# Patient Record
Sex: Female | Born: 1965 | Race: White | Hispanic: No | Smoking: Never smoker
Health system: Southern US, Community
[De-identification: ages and names within clinical notes are randomized; demographics above are authoritative.]

## PROBLEM LIST (undated history)

## (undated) DIAGNOSIS — D219 Benign neoplasm of connective and other soft tissue, unspecified: Secondary | ICD-10-CM

## (undated) DIAGNOSIS — F329 Major depressive disorder, single episode, unspecified: Secondary | ICD-10-CM

## (undated) DIAGNOSIS — F419 Anxiety disorder, unspecified: Secondary | ICD-10-CM

## (undated) DIAGNOSIS — N946 Dysmenorrhea, unspecified: Secondary | ICD-10-CM

## (undated) DIAGNOSIS — N920 Excessive and frequent menstruation with regular cycle: Secondary | ICD-10-CM

## (undated) DIAGNOSIS — F32A Depression, unspecified: Secondary | ICD-10-CM

## (undated) DIAGNOSIS — G2581 Restless legs syndrome: Secondary | ICD-10-CM

## (undated) DIAGNOSIS — E785 Hyperlipidemia, unspecified: Secondary | ICD-10-CM

## (undated) DIAGNOSIS — H9193 Unspecified hearing loss, bilateral: Secondary | ICD-10-CM

## (undated) DIAGNOSIS — R87629 Unspecified abnormal cytological findings in specimens from vagina: Secondary | ICD-10-CM

## (undated) DIAGNOSIS — I10 Essential (primary) hypertension: Secondary | ICD-10-CM

## (undated) HISTORY — DX: Unspecified abnormal cytological findings in specimens from vagina: R87.629

## (undated) HISTORY — DX: Major depressive disorder, single episode, unspecified: F32.9

## (undated) HISTORY — DX: Depression, unspecified: F32.A

## (undated) HISTORY — DX: Anxiety disorder, unspecified: F41.9

## (undated) HISTORY — PX: TONSILLECTOMY: SUR1361

## (undated) HISTORY — PX: OTHER SURGICAL HISTORY: SHX169

## (undated) HISTORY — DX: Benign neoplasm of connective and other soft tissue, unspecified: D21.9

## (undated) HISTORY — PX: INNER EAR SURGERY: SHX679

## (undated) HISTORY — DX: Excessive and frequent menstruation with regular cycle: N92.0

## (undated) HISTORY — DX: Restless legs syndrome: G25.81

## (undated) HISTORY — DX: Hyperlipidemia, unspecified: E78.5

## (undated) HISTORY — DX: Dysmenorrhea, unspecified: N94.6

## (undated) HISTORY — PX: KNEE SURGERY: SHX244

---

## 2004-09-26 ENCOUNTER — Ambulatory Visit: Payer: Self-pay

## 2004-12-18 HISTORY — PX: ABDOMINAL HYSTERECTOMY: SHX81

## 2005-09-10 ENCOUNTER — Emergency Department: Payer: Self-pay | Admitting: Emergency Medicine

## 2005-09-26 ENCOUNTER — Ambulatory Visit: Payer: Self-pay | Admitting: Obstetrics and Gynecology

## 2005-11-17 ENCOUNTER — Ambulatory Visit: Payer: Self-pay | Admitting: Obstetrics and Gynecology

## 2007-08-22 ENCOUNTER — Ambulatory Visit: Payer: Self-pay | Admitting: Obstetrics and Gynecology

## 2007-08-26 ENCOUNTER — Ambulatory Visit: Payer: Self-pay | Admitting: Obstetrics and Gynecology

## 2007-12-06 ENCOUNTER — Ambulatory Visit: Payer: Self-pay | Admitting: Obstetrics and Gynecology

## 2008-04-01 ENCOUNTER — Ambulatory Visit: Payer: Self-pay | Admitting: Obstetrics and Gynecology

## 2009-03-02 ENCOUNTER — Ambulatory Visit: Payer: Self-pay | Admitting: Obstetrics and Gynecology

## 2010-09-15 ENCOUNTER — Ambulatory Visit: Payer: Self-pay | Admitting: Obstetrics and Gynecology

## 2013-08-13 ENCOUNTER — Ambulatory Visit: Payer: Self-pay | Admitting: Obstetrics and Gynecology

## 2015-09-21 ENCOUNTER — Encounter: Payer: Self-pay | Admitting: Obstetrics and Gynecology

## 2015-09-21 ENCOUNTER — Ambulatory Visit (INDEPENDENT_AMBULATORY_CARE_PROVIDER_SITE_OTHER): Payer: BC Managed Care – PPO | Admitting: Obstetrics and Gynecology

## 2015-09-21 VITALS — BP 139/87 | HR 86 | Ht 60.0 in | Wt 135.8 lb

## 2015-09-21 DIAGNOSIS — N644 Mastodynia: Secondary | ICD-10-CM

## 2015-09-21 DIAGNOSIS — F418 Other specified anxiety disorders: Secondary | ICD-10-CM

## 2015-09-21 DIAGNOSIS — G2581 Restless legs syndrome: Secondary | ICD-10-CM | POA: Diagnosis not present

## 2015-09-21 DIAGNOSIS — N63 Unspecified lump in unspecified breast: Secondary | ICD-10-CM

## 2015-09-21 DIAGNOSIS — IMO0002 Reserved for concepts with insufficient information to code with codable children: Secondary | ICD-10-CM

## 2015-09-21 DIAGNOSIS — B977 Papillomavirus as the cause of diseases classified elsewhere: Secondary | ICD-10-CM | POA: Diagnosis not present

## 2015-09-21 DIAGNOSIS — Z1239 Encounter for other screening for malignant neoplasm of breast: Secondary | ICD-10-CM

## 2015-09-21 DIAGNOSIS — N72 Inflammatory disease of cervix uteri: Secondary | ICD-10-CM

## 2015-09-21 DIAGNOSIS — F329 Major depressive disorder, single episode, unspecified: Secondary | ICD-10-CM

## 2015-09-21 DIAGNOSIS — F419 Anxiety disorder, unspecified: Secondary | ICD-10-CM

## 2015-09-21 DIAGNOSIS — R888 Abnormal findings in other body fluids and substances: Secondary | ICD-10-CM | POA: Diagnosis not present

## 2015-09-21 NOTE — Patient Instructions (Signed)
1.  Patient is scheduled for screening mammogram 2.  Patient is given reassurance that her clinical exam today is normal. 3.  Patient is to monitor symptoms and do self breast exam monthly. 4.  Return in 2 months for Pap smear and pelvic exam.

## 2015-09-21 NOTE — Progress Notes (Signed)
Patient ID: Julia Edwards, female   DOB: 1966/06/18, 49 y.o.   MRN: [id number] Chief complaint: 1.LEFT SIDED BREAST PAIN AND PAIN UNDER ARM X WEEK- TENDER AND SWOLLEN     LAST MAMMO 2 YEARS AGO- WNL  49 year old white female, para0, status post [place]. For Uterine fibroids, Presents for evaluation of breast symptoms.  (See above). No family history of breast cancer except for a maternal aunt. Patient denies nipple discharge, trauma to breast recently.  She is not currently taking any hormone replacement therapy.  Past medical history, past surgical history, problem list, medications, and allergies are reviewed.  Review of Systems  Constitutional: Negative.   Respiratory: Negative.   Cardiovascular: Negative.   Musculoskeletal: Negative.   Skin: Negative.      OBJECTIVE:BP 139/87 mmHg  Pulse 86  Ht 5' (1.524 m)  Wt 135 lb 12.8 oz (61.598 kg)  BMI 26.52 kg/m2 Patient is a pleasant, well-appearing white female in no acute distress. Neck: Supple, without thyromegaly or adenopathy. Breasts: Bilaterally symmetric without dominant mass, adenopathy or nipple discharge.  There is no focal palpable abnormality in the area of the breast noted by the patient, which was symptomatic this past week.  ASSESSMENT: 1.  Recent breast symptoms including pain and swelling, now resolved. 2.  Normal breast exam today. 3.  In need of screening mammogram.  PLAN: 1.  Screening mammogram 2.  Continue with self breast exam monthly. 3.  Return as scheduled in 2 months for repeat Pap smear and pelvic exam.

## 2015-09-30 ENCOUNTER — Ambulatory Visit: Payer: Self-pay

## 2015-10-01 ENCOUNTER — Ambulatory Visit: Payer: Self-pay

## 2015-10-15 ENCOUNTER — Ambulatory Visit
Admission: RE | Admit: 2015-10-15 | Discharge: 2015-10-15 | Disposition: A | Discharge status: Home / Self Care | Payer: BC Managed Care – PPO | Source: Ambulatory Visit | Attending: Obstetrics and Gynecology | Admitting: Obstetrics and Gynecology

## 2015-10-15 DIAGNOSIS — Z1239 Encounter for other screening for malignant neoplasm of breast: Secondary | ICD-10-CM

## 2015-10-15 DIAGNOSIS — Z1231 Encounter for screening mammogram for malignant neoplasm of breast: Secondary | ICD-10-CM | POA: Diagnosis not present

## 2015-11-02 ENCOUNTER — Ambulatory Visit: Payer: Self-pay | Admitting: Obstetrics and Gynecology

## 2015-11-04 ENCOUNTER — Ambulatory Visit: Payer: Self-pay | Admitting: Obstetrics and Gynecology

## 2015-11-30 ENCOUNTER — Ambulatory Visit: Payer: Self-pay | Admitting: Obstetrics and Gynecology

## 2015-12-30 ENCOUNTER — Other Ambulatory Visit: Payer: Self-pay

## 2015-12-30 MED ORDER — CITALOPRAM HYDROBROMIDE 40 MG PO TABS: 40.0000 mg | ORAL_TABLET | Freq: Every day | ORAL | Status: DC

## 2016-01-20 ENCOUNTER — Ambulatory Visit: Payer: Self-pay | Admitting: Obstetrics and Gynecology

## 2016-01-27 ENCOUNTER — Ambulatory Visit (INDEPENDENT_AMBULATORY_CARE_PROVIDER_SITE_OTHER): Payer: BC Managed Care – PPO | Admitting: Obstetrics and Gynecology

## 2016-01-27 ENCOUNTER — Encounter: Payer: Self-pay | Admitting: Obstetrics and Gynecology

## 2016-01-27 VITALS — BP 127/74 | HR 81 | Ht 60.5 in | Wt 148.6 lb

## 2016-01-27 DIAGNOSIS — R896 Abnormal cytological findings in specimens from other organs, systems and tissues: Secondary | ICD-10-CM | POA: Diagnosis not present

## 2016-01-27 DIAGNOSIS — IMO0002 Reserved for concepts with insufficient information to code with codable children: Secondary | ICD-10-CM

## 2016-01-27 NOTE — Progress Notes (Signed)
Chief complaint: 1.  History of dysplasia. 2.  6 month interval Pap smear..  Patient presents for 6 month interv Pap smear.  Colposcopy was adequate in 04/29/2015.  ECC done at that time was negative.  Plan is for serial Pap smears every 6 months.  OBJECTIVE: BP 127/74 mmHg  Pulse 81  Ht 5' 0.5" (1.537 m)  Wt 148 lb 9.6 oz (67.405 kg)  BMI 28.53 kg/m2 Pleasant, well-appearing white female in no acute dis Pelvic exam: External genitalia-normal BUS-normal Vagina-normal Cervix-no lesions; no discharge. Left thigh-2 pedunculated papules 7 mm  ASSESSMENT: 1.  History of dysplasia   PLAN: 1.  Pap Smear, reflex 2.  Return in 6 months for Pap and annual examination  Brayton Mars, MD  Note: This dictation was prepared with Dragon dictation along with smaller phrase technology. Any transcriptional errors that result from this process are unintentional.

## 2016-01-27 NOTE — Patient Instructions (Signed)
1.  Pap smear is done today. 2.Return in 6 months for annual exam

## 2016-02-01 ENCOUNTER — Other Ambulatory Visit: Payer: BC Managed Care – PPO

## 2016-02-01 DIAGNOSIS — R300 Dysuria: Secondary | ICD-10-CM

## 2016-02-01 DIAGNOSIS — R350 Frequency of micturition: Secondary | ICD-10-CM

## 2016-02-01 NOTE — Progress Notes (Signed)
Pt stopped by office and left urine sample because of urinary frequency and burning. Unable to do urinalysis due to discoloration of what looked like Pyridium.

## 2016-02-02 ENCOUNTER — Telehealth: Payer: Self-pay

## 2016-02-02 LAB — URINALYSIS, ROUTINE W REFLEX MICROSCOPIC
BILIRUBIN UA: NEGATIVE
Glucose, UA: NEGATIVE
KETONES UA: NEGATIVE
Nitrite, UA: POSITIVE — AB
PROTEIN UA: NEGATIVE
SPEC GRAV UA: 1.021 (ref 1.005–1.030)
Urobilinogen, Ur: 1 mg/dL (ref 0.2–1.0)
pH, UA: 5.5 (ref 5.0–7.5)

## 2016-02-02 LAB — MICROSCOPIC EXAMINATION: Casts: NONE SEEN /lpf

## 2016-02-02 LAB — PAP IG W/ RFLX HPV ASCU: PAP Smear Comment: 0

## 2016-02-02 MED ORDER — NITROFURANTOIN MONOHYD MACRO 100 MG PO CAPS: 100.0000 mg | ORAL_CAPSULE | Freq: Two times a day (BID) | ORAL | Status: DC

## 2016-02-02 NOTE — Telephone Encounter (Signed)
Pt aware. Med erx. 

## 2016-02-02 NOTE — Telephone Encounter (Signed)
-----   Message from Brayton Mars, MD sent at 02/02/2016  3:20 PM EST ----- Please notify - Abnormal Labs Please: Macrobid twice a day for 7 days

## 2016-02-03 LAB — URINE CULTURE

## 2016-03-02 ENCOUNTER — Encounter: Payer: BC Managed Care – PPO | Admitting: Obstetrics and Gynecology

## 2016-04-25 ENCOUNTER — Encounter: Payer: Self-pay | Admitting: Obstetrics and Gynecology

## 2016-04-25 ENCOUNTER — Ambulatory Visit (INDEPENDENT_AMBULATORY_CARE_PROVIDER_SITE_OTHER): Payer: BC Managed Care – PPO | Admitting: Obstetrics and Gynecology

## 2016-04-25 VITALS — BP 126/78 | HR 102 | Ht 60.5 in | Wt 146.6 lb

## 2016-04-25 DIAGNOSIS — N882 Stricture and stenosis of cervix uteri: Secondary | ICD-10-CM

## 2016-04-25 DIAGNOSIS — R896 Abnormal cytological findings in specimens from other organs, systems and tissues: Secondary | ICD-10-CM

## 2016-04-25 DIAGNOSIS — R87622 Low grade squamous intraepithelial lesion on cytologic smear of vagina (LGSIL): Secondary | ICD-10-CM

## 2016-04-25 DIAGNOSIS — IMO0002 Reserved for concepts with insufficient information to code with codable children: Secondary | ICD-10-CM

## 2016-04-25 NOTE — Patient Instructions (Signed)
1. Colposcopy performed today. 2. Cervical dilation was performed 3. Endocervical curettage was obtained. 4. Return in 6 months for repeat colposcopy

## 2016-04-25 NOTE — Progress Notes (Signed)
Patient ID: Julia Edwards, female   DOB: 29-Dec-1965, 49 y.o.   MRN: [id number]  SUBJECTIVE: Patient here for colposcopy. LGSIL Pap on 01/27/16. Patient is nulliparous and not on any contraception, s/p partial hysterectomy.  Not a smoker. Has a hx of abnormal Paps.  OBJECTIVE: BP 126/78, pulse 102, Ht 5' 0.5" (1.537 m), Wt 146 lb 9.6 oz (66.497 kg). Pleasant, well-appearing female, NAD. Pelvic Exam  External: Normal  BUS: Normal  Vagina: Normal  Cervix: Pinpoint OS. Nabothian cyst at 1 o'clock. Stenotic.   PROCEDURE: Colposcopy upper adjacent vagina with ECC following endocervical canal dilation DESCRIPTION: Patient was placed in dorsal lithotomy position. A Graves' speculum was placed into the vagina. Vagina and cervix was swabbed with acetic acid solution. No ectocervical lesions are identified. Upper vagina is normal. The squamocolumnar junction could not be visualized due to pinpoint stenotic os. Lacrimal duct probes are performed to dilate the endocervical canal. ECC is performed with serrated curette. (- endocervical canal dilation and ECC due to inability to visualize squamo-columnar junction.)   ASSESSMENT: 1. LGSIL Pap smear 2. Status post Julia Edwards 3. No ectocervical disease on colposcopy; SCJ was not visualized due to cervical stenosis     PLAN: 1. Endocervical canal dilation 2. ECC 3. Return in 6 months for colposcopy and Pap/ECC   Claiborne Billings Rayburn PA-S Brayton Mars, MD   I have seen, interviewed, and examined the patient in conjunction with the [facility].A. student and affirm the diagnosis and management plan. Eufemia Prindle A. Ajit Errico, MD, FACOG   Note: This dictation was prepared with Dragon dictation along with smaller phrase technology. Any transcriptional errors that result from this process are unintentional.

## 2016-04-27 LAB — PATHOLOGY

## 2016-05-09 ENCOUNTER — Encounter: Payer: Self-pay | Admitting: Obstetrics and Gynecology

## 2016-05-09 ENCOUNTER — Telehealth: Payer: Self-pay

## 2016-05-09 NOTE — Telephone Encounter (Signed)
Pt aware. Appt made for 11/15/2015.

## 2016-07-19 ENCOUNTER — Encounter: Payer: BC Managed Care – PPO | Admitting: Obstetrics and Gynecology

## 2016-07-26 ENCOUNTER — Encounter: Payer: BC Managed Care – PPO | Admitting: Obstetrics and Gynecology

## 2016-08-03 ENCOUNTER — Encounter: Payer: BC Managed Care – PPO | Admitting: Obstetrics and Gynecology

## 2016-10-05 ENCOUNTER — Encounter: Payer: BC Managed Care – PPO | Admitting: Obstetrics and Gynecology

## 2016-11-10 ENCOUNTER — Other Ambulatory Visit: Payer: Self-pay | Admitting: Obstetrics and Gynecology

## 2016-11-14 ENCOUNTER — Encounter: Payer: BC Managed Care – PPO | Admitting: Obstetrics and Gynecology

## 2016-12-28 ENCOUNTER — Other Ambulatory Visit: Payer: Self-pay | Admitting: Obstetrics and Gynecology

## 2016-12-28 DIAGNOSIS — Z1231 Encounter for screening mammogram for malignant neoplasm of breast: Secondary | ICD-10-CM

## 2017-01-02 ENCOUNTER — Encounter: Payer: BC Managed Care – PPO | Admitting: Obstetrics and Gynecology

## 2017-01-19 ENCOUNTER — Ambulatory Visit
Admission: RE | Admit: 2017-01-19 | Discharge: 2017-01-19 | Disposition: A | Discharge status: Home / Self Care | Payer: BC Managed Care – PPO | Source: Ambulatory Visit | Attending: Obstetrics and Gynecology | Admitting: Obstetrics and Gynecology

## 2017-01-19 DIAGNOSIS — Z1231 Encounter for screening mammogram for malignant neoplasm of breast: Secondary | ICD-10-CM | POA: Insufficient documentation

## 2017-01-23 ENCOUNTER — Encounter: Payer: Self-pay | Admitting: Obstetrics and Gynecology

## 2017-01-23 ENCOUNTER — Ambulatory Visit (INDEPENDENT_AMBULATORY_CARE_PROVIDER_SITE_OTHER): Payer: BC Managed Care – PPO | Admitting: Obstetrics and Gynecology

## 2017-01-23 VITALS — BP 153/82 | HR 75 | Ht 60.0 in | Wt 146.9 lb

## 2017-01-23 DIAGNOSIS — R87612 Low grade squamous intraepithelial lesion on cytologic smear of cervix (LGSIL): Secondary | ICD-10-CM

## 2017-01-23 NOTE — Patient Instructions (Signed)
1. Colposcopy is completed today without evidence of abnormality 2. Pap smear is obtained 3. Return in 1 year for annual exam

## 2017-01-23 NOTE — Progress Notes (Signed)
Chief: 1. History of dysplasia 2. Colposcopy  Cervical dysplasia history: 04/29/2015 colposcopy: Adequate; ECC-negative 01/27/2016 Pap smear-LGSIL 04/25/2016 colposcopy: Inadequate; ECC-negative; endocervical canal dilation with lacrimal duct probes is performed 01/23/2017 colposcopy: Inadequate; no biopsies; Pap smear is obtained  Past medical history, past surgical history, problem list, medications, and allergies are reviewed  OBJECTIVE: BP (!) 153/82   Pulse 75   Ht 5' (1.524 m)   Wt 146 lb 14.4 oz (66.6 kg)   BMI 28.69 kg/m  Pleasant female in no acute distress. Alert and oriented. Pelvic exam: External genitalia-normal BUS-normal Vagina-normal Cervix-stenotic os; no gross the cervical lesions Uterus-surgically absent  PROCEDURE: Colposcopy of upper adjacent vagina and cervix Verbal consent is obtained. Patient was placed in the dorsal lithotomy position. A Graves' speculum is inserted to facilitate visualization of cervix. Pap smear is obtained. Acetic acid is applied to the cervix and vagina. The SCJ is not fully visualized. No ectocervical lesions are identified. No biopsies are taken. Procedure was well-tolerated.  ASSESSMENT: 1. History of LGSIL Pap smear 2. Colposcopy without evidence of disease today  PLAN: 1. Pap smear 2. Return in 1 year for annual exam, sooner if dysplasia is identified on Pap smear  Brayton Mars, MD  Note: This dictation was prepared with Dragon dictation along with smaller phrase technology. Any transcriptional errors that result from this process are unintentional.

## 2017-01-25 LAB — PAP IG AND HPV HIGH-RISK
HPV, HIGH-RISK: NEGATIVE
PAP Smear Comment: 0

## 2017-01-29 ENCOUNTER — Encounter: Payer: Self-pay | Admitting: Obstetrics and Gynecology

## 2017-03-22 ENCOUNTER — Encounter: Payer: Self-pay | Admitting: Obstetrics and Gynecology

## 2017-03-22 ENCOUNTER — Ambulatory Visit (INDEPENDENT_AMBULATORY_CARE_PROVIDER_SITE_OTHER): Payer: BC Managed Care – PPO | Admitting: Obstetrics and Gynecology

## 2017-03-22 VITALS — BP 136/96 | HR 108 | Wt 132.2 lb

## 2017-03-22 DIAGNOSIS — F329 Major depressive disorder, single episode, unspecified: Secondary | ICD-10-CM

## 2017-03-22 DIAGNOSIS — F418 Other specified anxiety disorders: Secondary | ICD-10-CM | POA: Diagnosis not present

## 2017-03-22 DIAGNOSIS — F419 Anxiety disorder, unspecified: Principal | ICD-10-CM

## 2017-03-22 MED ORDER — VENLAFAXINE HCL ER 37.5 MG PO CP24: 37.5000 mg | ORAL_CAPSULE | Freq: Every day | ORAL | 1 refills | Status: DC

## 2017-03-22 NOTE — Patient Instructions (Signed)
1. Begin Effexor XL are 37.5 mg daily 2. Follow-up with Dr. Nicolasa Ducking in psychiatry in [place]

## 2017-03-22 NOTE — Progress Notes (Signed)
Chief complaint: 1. Anxiety/depression  The patient presents today for evaluation of anxiety/depression. She is under extreme life stressors and is seeing a Social worker and the employee assistance program. Patient is in the process of possibly old through a divorce; she has a 50-year-old adopted child. Patient has been on Wellbutrin and citalopram past with reasonable success. However, she did not like the way the medications made her feel.  PH Q-9 questionnaire today is notable for severe depression. She denies suicidal ideations  OBJECTIVE: BP (!) 136/96   Pulse (!) 108   Wt 132 lb 3.2 oz (60 kg)   BMI 25.82 kg/m  Pleasant female in no acute distress. Affect is appropriate.  PH Q-9 questionnaire score: 21  ASSESSMENT: 1. Anxiety/depression, moderate to severe without suicidal ideation  PLAN: 1. Begin Effexor XL 37.5 mg daily 2. Follow-up with [facility] in psychiatry in 6 weeks. 3. If symptomatology worsens, contact us immediately.  A total of 15 minutes were spent face-to-face with the patient during this encounter and over half of that time dealt with counseling and coordination of care.  Brayton Mars, MD  Note: This dictation was prepared with Dragon dictation along with smaller phrase technology. Any transcriptional errors that result from this process are unintentional.

## 2017-03-26 ENCOUNTER — Telehealth: Payer: Self-pay | Admitting: Obstetrics and Gynecology

## 2017-03-26 MED ORDER — CITALOPRAM HYDROBROMIDE 20 MG PO TABS: 20.0000 mg | ORAL_TABLET | Freq: Every day | ORAL | 2 refills | Status: DC

## 2017-03-26 NOTE — Telephone Encounter (Signed)
Per mad ok to give celexa 20 mg. Pt aware. Med erx.

## 2017-03-26 NOTE — Telephone Encounter (Signed)
Patient left VM that the new medication is making her symptoms worse and she would like to know if the Citalopram can be called in for her but not the 40mg  she wants something less than 40 mg  She did not leave pharamacy information Please call

## 2018-01-24 ENCOUNTER — Encounter: Payer: BC Managed Care – PPO | Admitting: Obstetrics and Gynecology

## 2018-02-25 ENCOUNTER — Encounter: Payer: Self-pay | Admitting: Obstetrics and Gynecology

## 2018-02-25 ENCOUNTER — Emergency Department: Payer: BC Managed Care – PPO

## 2018-02-25 ENCOUNTER — Ambulatory Visit (INDEPENDENT_AMBULATORY_CARE_PROVIDER_SITE_OTHER): Payer: BC Managed Care – PPO | Admitting: Obstetrics and Gynecology

## 2018-02-25 ENCOUNTER — Other Ambulatory Visit: Payer: Self-pay

## 2018-02-25 VITALS — BP 131/84 | HR 76 | Ht 60.0 in | Wt 142.9 lb

## 2018-02-25 DIAGNOSIS — Z1231 Encounter for screening mammogram for malignant neoplasm of breast: Secondary | ICD-10-CM | POA: Diagnosis not present

## 2018-02-25 DIAGNOSIS — Z90711 Acquired absence of uterus with remaining cervical stump: Secondary | ICD-10-CM

## 2018-02-25 DIAGNOSIS — Z1211 Encounter for screening for malignant neoplasm of colon: Secondary | ICD-10-CM | POA: Diagnosis not present

## 2018-02-25 DIAGNOSIS — R079 Chest pain, unspecified: Secondary | ICD-10-CM | POA: Insufficient documentation

## 2018-02-25 DIAGNOSIS — Z79899 Other long term (current) drug therapy: Secondary | ICD-10-CM | POA: Diagnosis not present

## 2018-02-25 DIAGNOSIS — Z01419 Encounter for gynecological examination (general) (routine) without abnormal findings: Secondary | ICD-10-CM | POA: Diagnosis not present

## 2018-02-25 DIAGNOSIS — Z1239 Encounter for other screening for malignant neoplasm of breast: Secondary | ICD-10-CM

## 2018-02-25 LAB — CBC
HEMATOCRIT: 39.8 % (ref 35.0–47.0)
HEMOGLOBIN: 13.7 g/dL (ref 12.0–16.0)
MCH: 31.2 pg (ref 26.0–34.0)
MCHC: 34.4 g/dL (ref 32.0–36.0)
MCV: 90.5 fL (ref 80.0–100.0)
Platelets: 229 10*3/uL (ref 150–440)
RBC: 4.4 MIL/uL (ref 3.80–5.20)
RDW: 13.4 % (ref 11.5–14.5)
WBC: 6.5 10*3/uL (ref 3.6–11.0)

## 2018-02-25 NOTE — ED Triage Notes (Signed)
Pt arrives to ED via POV with c/o chest pain x2 days. Pt reports (+) nausea, but denies vomiting. Pt reports left-sided chest pain without radiation, but does state that pain increases with inspiration. Pt denies previous cardiac history. Pt reports (+) dizziness, but no reported LOC. Pt is A&O, in NAD; RR even, regular, and unlabored.

## 2018-02-25 NOTE — Progress Notes (Signed)
ANNUAL PREVENTATIVE CARE GYN  ENCOUNTER NOTE  Subjective:       Julia Edwards is a 52 y.o. G0P0000 female here for a routine annual gynecologic exam.  Current complaints: 1. No complaints  Mild vasomotor symptoms are noted; hot flashes occur primarily at night and wake her up from sleep; however, she does not desire any treatment.  She is not experiencing any vaginal dryness or dyspareunia  Bowel function is normal. Bladder function is normal. Patient has been started on sertraline by Dr. Jake Michaelis and is doing well with control of anxiety symptoms.   Gynecologic History No LMP recorded. Patient has had a hysterectomy. Contraception: status post hysterectomy status post [place]  last Pap: 02/02/2017 lgsil/negResults were: abnormal Last mammogram: 01/22/2017 birad 1. Results were: normal  Obstetric History OB History  Gravida Para Term Preterm AB Living  0 0 0 0 0 0  SAB TAB Ectopic Multiple Live Births  0 0 0 0          Past Medical History:  Diagnosis Date  . Anxiety   . Depression   . Fibroids    h/o  . Heavy periods   . Hyperlipemia   . Painful menstrual periods   . RLS (restless legs syndrome)   . 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 ABDOMINAL HYSTERECTOMY  2006   lsh  . colposcopy    . INNER EAR SURGERY  [id number]    Current Outpatient Medications on File Prior to Visit  Medication Sig Dispense Refill  . sertraline (ZOLOFT) 100 MG tablet Take 100 mg by mouth daily.     No current facility-administered medications on file prior to visit.     Allergies  Allergen Reactions  . Codeine Itching and Nausea And Vomiting  . Darvon [Propoxyphene] Itching and Nausea And Vomiting  . Sulfa Antibiotics   . Ultram [Tramadol Hcl] Itching and Nausea And Vomiting    Social History   Socioeconomic History  . Marital status: Legally Separated    Spouse name: Not on file  . Number of children: Not  on file  . Years of education: Not on file  . Highest education level: Not on file  Social Needs  . Financial resource strain: Not on file  . Food insecurity - worry: Not on file  . Food insecurity - inability: Not on file  . Transportation needs - medical: Not on file  . Transportation needs - non-medical: Not on file  Occupational History  . Not on file  Tobacco Use  . Smoking status: Never Smoker  . Smokeless tobacco: Never Used  Substance and Sexual Activity  . Alcohol use: Yes    Comment: occas  . Drug use: No  . Sexual activity: Not Currently    Birth control/protection: Surgical  Other Topics Concern  . Not on file  Social History Narrative  . Not on file    Family History  Problem Relation Age of Onset  . COPD Mother   . Lung cancer Father   . Heart disease Maternal Grandmother   . Heart disease Maternal Grandfather   . Heart disease Paternal Grandmother   . Heart disease Paternal Grandfather   . Breast cancer Maternal Aunt 70  . Cancer Neg Hx   . Diabetes Neg Hx     The following portions of the patient's history were reviewed and updated as appropriate: allergies, current medications, past family  history, past medical history, past social history, past surgical history and problem list.  Review of Systems Review of Systems  Constitutional:       Night sweats, mild, not affecting sleep  HENT: Negative.   Eyes: Negative.   Cardiovascular: Negative.   Gastrointestinal: Negative.   Genitourinary: Negative.   Musculoskeletal: Negative.   Skin:       Multiple skin tags are present; patient encouraged to see dermatology for removal  Neurological: Negative.   Endo/Heme/Allergies: Negative.   Psychiatric/Behavioral: Negative.        On sertraline with good control of symptoms      Objective:   BP 131/84   Pulse 76   Ht 5' (1.524 m)   Wt 142 lb 14.4 oz (64.8 kg)   BMI 27.91 kg/m  CONSTITUTIONAL: Well-developed, well-nourished female in no acute  distress.  PSYCHIATRIC: Normal mood and affect. Normal behavior. Normal judgment and thought content. Hunter: Alert and oriented to person, place, and time. Normal muscle tone coordination. No cranial nerve deficit noted. HENT:  Normocephalic, atraumatic, External right and left ear normal. Oropharynx is clear and moist EYES: Conjunctivae and EOM are normal. No scleral icterus.  NECK: Normal range of motion, supple, no masses.  Normal thyroid.  SKIN: Skin is warm and dry. No rash noted. Not diaphoretic. No erythema. No pallor. CARDIOVASCULAR: Normal heart rate noted, regular rhythm, no murmur. RESPIRATORY: Clear to auscultation bilaterally. Effort and breath sounds normal, no problems with respiration noted. BREASTS: Symmetric in size. No masses, skin changes, nipple drainage, or lymphadenopathy. ABDOMEN: Soft, normal bowel sounds, no distention noted.  No tenderness, rebound or guarding.  BLADDER: Normal PELVIC:  External Genitalia: Normal  BUS: Normal  Vagina: Normal estrogen effect; good vault support  Cervix: No lesions; no cervical motion tenderness; good support high in the vagina  Uterus: Surgically absent  Adnexa: Normal; nonpalpable nontender  RV: External Exam NormaI, No Rectal Masses and Normal Sphincter tone  MUSCULOSKELETAL: Normal range of motion. No tenderness.  No cyanosis, clubbing, or edema.  2+ distal pulses. LYMPHATIC: No Axillary, Supraclavicular, or Inguinal Adenopathy.    Assessment:   Annual gynecologic examination 52 y.o. Contraception: status post hysterectomy status post [facility] [id number] Vasomotor symptoms, mild, not affecting activities of daily living  Plan:  Pap: Pap Co Test Mammogram: Ordered Stool Guaiac Testing:  scheduled colonoscopy Labs: Lipid 1, FBS, TSH and Hemoglobin A1C Routine preventative health maintenance measures emphasized: Exercise/Diet/Weight control, Tobacco Warnings and Alcohol/Substance use risks Calcium and vitamin D  supplementation is recommended Return to [place], CMA  Brayton Mars, MD  Note: This dictation was prepared with Dragon dictation along with smaller phrase technology. Any transcriptional errors that result from this process are unintentional.

## 2018-02-25 NOTE — Patient Instructions (Signed)
1.  Pap smear is done 2.  Mammogram is ordered 3.  Screening colonoscopy is scheduled 4.  Screening labs are to be obtained through primary care 5.  Continue with healthy eating and exercise 6.  Calcium and vitamin D supplementation twice a day is recommended 7.  Return in 1 year for annual exam   Health Maintenance for Postmenopausal Women Menopause is a normal process in which your reproductive ability comes to an end. This process happens gradually over a span of months to years, usually between the ages of 4 and 48. Menopause is complete when you have missed 12 consecutive menstrual periods. It is important to talk with your health care provider about some of the most common conditions that affect postmenopausal women, such as heart disease, cancer, and bone loss (osteoporosis). Adopting a healthy lifestyle and getting preventive care can help to promote your health and wellness. Those actions can also lower your chances of developing some of these common conditions. What should I know about menopause? During menopause, you may experience a number of symptoms, such as:  Moderate-to-severe hot flashes.  Night sweats.  Decrease in sex drive.  Mood swings.  Headaches.  Tiredness.  Irritability.  Memory problems.  Insomnia.  Choosing to treat or not to treat menopausal changes is an individual decision that you make with your health care provider. What should I know about hormone replacement therapy and supplements? Hormone therapy products are effective for treating symptoms that are associated with menopause, such as hot flashes and night sweats. Hormone replacement carries certain risks, especially as you become older. If you are thinking about using estrogen or estrogen with progestin treatments, discuss the benefits and risks with your health care provider. What should I know about heart disease and stroke? Heart disease, heart attack, and stroke become more likely as you  age. This may be due, in part, to the hormonal changes that your body experiences during menopause. These can affect how your body processes dietary fats, triglycerides, and cholesterol. Heart attack and stroke are both medical emergencies. There are many things that you can do to help prevent heart disease and stroke:  Have your blood pressure checked at least every 1-2 years. High blood pressure causes heart disease and increases the risk of stroke.  If you are 49-48 years old, ask your health care provider if you should take aspirin to prevent a heart attack or a stroke.  Do not use any tobacco products, including cigarettes, chewing tobacco, or electronic cigarettes. If you need help quitting, ask your health care provider.  It is important to eat a healthy diet and maintain a healthy weight. ? Be sure to include plenty of vegetables, fruits, low-fat dairy products, and lean protein. ? Avoid eating foods that are high in solid fats, added sugars, or salt (sodium).  Get regular exercise. This is one of the most important things that you can do for your health. ? Try to exercise for at least 150 minutes each week. The type of exercise that you do should increase your heart rate and make you sweat. This is known as moderate-intensity exercise. ? Try to do strengthening exercises at least twice each week. Do these in addition to the moderate-intensity exercise.  Know your numbers.Ask your health care provider to check your cholesterol and your blood glucose. Continue to have your blood tested as directed by your health care provider.  What should I know about cancer screening? There are several types of cancer. Take the  following steps to reduce your risk and to catch any cancer development as early as possible. Breast Cancer  Practice breast self-awareness. ? This means understanding how your breasts normally appear and feel. ? It also means doing regular breast self-exams. Let your health  care provider know about any changes, no matter how small.  If you are 67 or older, have a clinician do a breast exam (clinical breast exam or CBE) every year. Depending on your age, family history, and medical history, it may be recommended that you also have a yearly breast X-ray (mammogram).  If you have a family history of breast cancer, talk with your health care provider about genetic screening.  If you are at high risk for breast cancer, talk with your health care provider about having an MRI and a mammogram every year.  Breast cancer (BRCA) gene test is recommended for women who have family members with BRCA-related cancers. Results of the assessment will determine the need for genetic counseling and BRCA1 and for BRCA2 testing. BRCA-related cancers include these types: ? Breast. This occurs in males or females. ? Ovarian. ? Tubal. This may also be called fallopian tube cancer. ? Cancer of the abdominal or pelvic lining (peritoneal cancer). ? Prostate. ? Pancreatic.  Cervical, Uterine, and Ovarian Cancer Your health care provider may recommend that you be screened regularly for cancer of the pelvic organs. These include your ovaries, uterus, and vagina. This screening involves a pelvic exam, which includes checking for microscopic changes to the surface of your cervix (Pap test).  For women ages 21-65, health care providers may recommend a pelvic exam and a Pap test every three years. For women ages 75-65, they may recommend the Pap test and pelvic exam, combined with testing for human papilloma virus (HPV), every five years. Some types of HPV increase your risk of cervical cancer. Testing for HPV may also be done on women of any age who have unclear Pap test results.  Other health care providers may not recommend any screening for nonpregnant women who are considered low risk for pelvic cancer and have no symptoms. Ask your health care provider if a screening pelvic exam is right for  you.  If you have had past treatment for cervical cancer or a condition that could lead to cancer, you need Pap tests and screening for cancer for at least 20 years after your treatment. If Pap tests have been discontinued for you, your risk factors (such as having a new sexual partner) need to be reassessed to determine if you should start having screenings again. Some women have medical problems that increase the chance of getting cervical cancer. In these cases, your health care provider may recommend that you have screening and Pap tests more often.  If you have a family history of uterine cancer or ovarian cancer, talk with your health care provider about genetic screening.  If you have vaginal bleeding after reaching menopause, tell your health care provider.  There are currently no reliable tests available to screen for ovarian cancer.  Lung Cancer Lung cancer screening is recommended for adults 15-64 years old who are at high risk for lung cancer because of a history of smoking. A yearly low-dose CT scan of the lungs is recommended if you:  Currently smoke.  Have a history of at least 30 pack-years of smoking and you currently smoke or have quit within the past 15 years. A pack-year is smoking an average of one pack of cigarettes per day  for one year.  Yearly screening should:  Continue until it has been 15 years since you quit.  Stop if you develop a health problem that would prevent you from having lung cancer treatment.  Colorectal Cancer  This type of cancer can be detected and can often be prevented.  Routine colorectal cancer screening usually begins at age 50 and continues through age 75.  If you have risk factors for colon cancer, your health care provider may recommend that you be screened at an earlier age.  If you have a family history of colorectal cancer, talk with your health care provider about genetic screening.  Your health care provider may also recommend  using home test kits to check for hidden blood in your stool.  A small camera at the end of a tube can be used to examine your colon directly (sigmoidoscopy or colonoscopy). This is done to check for the earliest forms of colorectal cancer.  Direct examination of the colon should be repeated every 5-10 years until age 75. However, if early forms of precancerous polyps or small growths are found or if you have a family history or genetic risk for colorectal cancer, you may need to be screened more often.  Skin Cancer  Check your skin from head to toe regularly.  Monitor any moles. Be sure to tell your health care provider: ? About any new moles or changes in moles, especially if there is a change in a mole's shape or color. ? If you have a mole that is larger than the size of a pencil eraser.  If any of your family members has a history of skin cancer, especially at a young age, talk with your health care provider about genetic screening.  Always use sunscreen. Apply sunscreen liberally and repeatedly throughout the day.  Whenever you are outside, protect yourself by wearing long sleeves, pants, a wide-brimmed hat, and sunglasses.  What should I know about osteoporosis? Osteoporosis is a condition in which bone destruction happens more quickly than new bone creation. After menopause, you may be at an increased risk for osteoporosis. To help prevent osteoporosis or the bone fractures that can happen because of osteoporosis, the following is recommended:  If you are 19-50 years old, get at least 1,000 mg of calcium and at least 600 mg of vitamin D per day.  If you are older than age 50 but younger than age 70, get at least 1,200 mg of calcium and at least 600 mg of vitamin D per day.  If you are older than age 70, get at least 1,200 mg of calcium and at least 800 mg of vitamin D per day.  Smoking and excessive alcohol intake increase the risk of osteoporosis. Eat foods that are rich in  calcium and vitamin D, and do weight-bearing exercises several times each week as directed by your health care provider. What should I know about how menopause affects my mental health? Depression may occur at any age, but it is more common as you become older. Common symptoms of depression include:  Low or sad mood.  Changes in sleep patterns.  Changes in appetite or eating patterns.  Feeling an overall lack of motivation or enjoyment of activities that you previously enjoyed.  Frequent crying spells.  Talk with your health care provider if you think that you are experiencing depression. What should I know about immunizations? It is important that you get and maintain your immunizations. These include:  Tetanus, diphtheria, and pertussis (Tdap)   booster vaccine.  Influenza every year before the flu season begins.  Pneumonia vaccine.  Shingles vaccine.  Your health care provider may also recommend other immunizations. This information is not intended to replace advice given to you by your health care provider. Make sure you discuss any questions you have with your health care provider. Document Released: 01/26/2006 Document Revised: 06/23/2016 Document Reviewed: 09/07/2015 Elsevier Interactive Patient Education  2018 [employer].

## 2018-02-26 ENCOUNTER — Emergency Department
Admission: EM | Admit: 2018-02-26 | Discharge: 2018-02-26 | Disposition: A | Discharge status: Home / Self Care | Payer: BC Managed Care – PPO | Attending: Emergency Medicine | Admitting: Emergency Medicine

## 2018-02-26 DIAGNOSIS — R079 Chest pain, unspecified: Secondary | ICD-10-CM

## 2018-02-26 LAB — FIBRIN DERIVATIVES D-DIMER (ARMC ONLY): Fibrin derivatives D-dimer (ARMC): 292.27 ng/mL (FEU) (ref 0.00–499.00)

## 2018-02-26 LAB — BASIC METABOLIC PANEL
Anion gap: 9 (ref 5–15)
BUN: 19 mg/dL (ref 6–20)
CALCIUM: 9.1 mg/dL (ref 8.9–10.3)
CO2: 26 mmol/L (ref 22–32)
Chloride: 105 mmol/L (ref 101–111)
Creatinine, Ser: 0.85 mg/dL (ref 0.44–1.00)
GFR calc Af Amer: >60 mL/min (ref >60)
GLUCOSE: 111 mg/dL — AB (ref 65–99)
Potassium: 3.6 mmol/L (ref 3.5–5.1)
Sodium: 140 mmol/L (ref 135–145)

## 2018-02-26 LAB — TROPONIN I
Troponin I: <0.03 ng/mL (ref <0.03)
Troponin I: <0.03 ng/mL (ref <0.03)

## 2018-02-26 MED ORDER — FAMOTIDINE 40 MG PO TABS: 40.0000 mg | ORAL_TABLET | Freq: Every evening | ORAL | 0 refills | Status: DC

## 2018-02-26 MED ORDER — GI COCKTAIL ~~LOC~~
30.0000 mL | Freq: Once | ORAL | Status: AC
  Administered 2018-02-26: 30 mL via ORAL
  Filled 2018-02-26: qty 30

## 2018-02-26 MED ORDER — KETOROLAC TROMETHAMINE 30 MG/ML IJ SOLN
30.0000 mg | Freq: Once | INTRAMUSCULAR | Status: AC
  Administered 2018-02-26: 30 mg via INTRAVENOUS
  Filled 2018-02-26: qty 1

## 2018-02-26 NOTE — ED Notes (Signed)
ED Provider at bedside. 

## 2018-02-26 NOTE — Discharge Instructions (Signed)
Please follow-up with your primary care physician for further evaluation of your chest pain.  Please return with any worsening symptoms any worsening shortness of breath dizziness lightheadedness or any other concerns.

## 2018-02-26 NOTE — ED Provider Notes (Signed)
[facility] Emergency Department Provider Note   ____________________________________________   First MD Initiated Contact with Patient 02/26/18 [phone]     (approximate)  I have reviewed the triage vital signs and the nursing notes.   HISTORY  Chief Complaint Chest Pain    HPI Julia Edwards is a 53 y.o. female who comes into the hospital today with some left-sided chest pain since yesterday.  The patient's pain is under her left breast.  She reports that it started hurting when she was taking a deep breath.  She reports it also hurts to breathe.  The patient denies any recent long trips in the car or plane.  She has not taken anything for pain.  The patient is never had this before.  She reports that it is right under her left breast.  The pain is currently a 2 out of 10 but if she moves she states it is worse.  The patient denies any shortness of breath but has some mild lightheadedness.  She denies any sweats nausea or vomiting.  The patient had some nausea late last week.  The patient decided to come into the hospital today for further evaluation of her symptoms.  Past Medical History:  Diagnosis Date  . Anxiety   . Depression   . Fibroids    h/o  . Heavy periods   . Hyperlipemia   . Painful menstrual periods   . RLS (restless legs syndrome)   . Vaginal Pap smear, abnormal    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     Patient Active Problem List   Diagnosis Date Noted  . Breast pain 09/21/2015  . Breast swelling 09/21/2015  . HPV test positive 09/21/2015  . Anxiety and depression 09/21/2015  . Restless leg syndrome 09/21/2015    Past Surgical History:  Procedure Laterality Date  . ABDOMINAL HYSTERECTOMY  2006   lsh  . colposcopy    . INNER EAR SURGERY  [id number]    Prior to Admission medications   Medication Sig Start Date End Date Taking? Authorizing Provider  famotidine (PEPCID) 40 MG tablet Take 1 tablet (40 mg total) by mouth every evening.  02/26/18 02/26/19  Loney Hering, MD  sertraline (ZOLOFT) 100 MG tablet Take 100 mg by mouth daily.    [provider]    Allergies Codeine; Darvon [propoxyphene]; Sulfa antibiotics; and Ultram [tramadol hcl]  Family History  Problem Relation Age of Onset  . COPD Mother   . Lung cancer Father   . Heart disease Maternal Grandmother   . Heart disease Maternal Grandfather   . Heart disease Paternal Grandmother   . Heart disease Paternal Grandfather   . Breast cancer Maternal Aunt 80  . Cancer Neg Hx   . Diabetes Neg Hx     Social History Social History   Tobacco Use  . Smoking status: Never Smoker  . Smokeless tobacco: Never Used  Substance Use Topics  . Alcohol use: Yes    Comment: occas  . Drug use: No    Review of Systems  Constitutional: No fever/chills Eyes: No visual changes. ENT: No sore throat. Cardiovascular:  chest pain. Respiratory:  shortness of breath. Gastrointestinal: No abdominal pain.  No nausea, no vomiting.  No diarrhea.  No constipation. Genitourinary: Negative for dysuria. Musculoskeletal: Negative for back pain. Skin: Negative for rash. Neurological: Negative for headaches, focal weakness or numbness.   ____________________________________________   PHYSICAL EXAM:  VITAL SIGNS: ED Triage Vitals  Enc Vitals Group  BP 02/25/18 2320 (!) 141/77     Pulse Rate 02/25/18 2320 73     Resp 02/25/18 2320 18     Temp 02/25/18 2320 98.3 F (36.8 C)     Temp Source 02/25/18 2320 Oral     SpO2 02/25/18 2320 97 %     Weight 02/25/18 2321 142 lb (64.4 kg)     Height 02/25/18 2321 5\' 6"  (1.676 m)     Head Circumference --      Peak Flow --      Pain Score 02/25/18 2321 6     Pain Loc --      Pain Edu? --      Excl. in [place]? --     Constitutional: Alert and oriented. Well appearing and in moderate distress. Eyes: Conjunctivae are normal. PERRL. EOMI. Head: Atraumatic. Nose: No congestion/rhinnorhea. Mouth/Throat: Mucous  membranes are moist.  Oropharynx non-erythematous. Cardiovascular: Normal rate, regular rhythm. Grossly normal heart sounds.  Good peripheral circulation. Respiratory: Normal respiratory effort.  No retractions. Lungs CTAB. Gastrointestinal: Soft and nontender. No distention.  Positive bowel sounds Musculoskeletal: No lower extremity tenderness nor edema.   Neurologic:  Normal speech and language.  Skin:  Skin is warm, dry and intact.  Psychiatric: Mood and affect are normal.   ____________________________________________   LABS (all labs ordered are listed, but only abnormal results are displayed)  Labs Reviewed  BASIC METABOLIC PANEL - Abnormal; Notable for the following components:      Result Value   Glucose, Bld 111 (*)    All other components within normal limits  CBC  TROPONIN I  TROPONIN I  FIBRIN DERIVATIVES D-DIMER ([facility] ONLY)   ____________________________________________  EKG  ED ECG REPORT I, Loney Hering, the attending physician, personally viewed and interpreted this ECG.   Date: 02/25/2018  EKG Time: 2320  Rate: 66  Rhythm: normal sinus rhythm  Axis: normal  Intervals:none  ST&T Change: none  ____________________________________________  RADIOLOGY  ED MD interpretation:  CXR: NAD  Official radiology report(s): Dg Chest 2 View  Result Date: 02/25/2018 CLINICAL DATA:  Chest pain x2 days EXAM: CHEST - 2 VIEW COMPARISON:  None. FINDINGS: The heart size and mediastinal contours are within normal limits. Both lungs are clear. The visualized skeletal structures are unremarkable. IMPRESSION: No active cardiopulmonary disease. Electronically Signed   By: Ashley Royalty M.D.   On: 02/25/2018 23:42    ____________________________________________   PROCEDURES  Procedure(s) performed: None  Procedures  Critical Care performed: No  ____________________________________________   INITIAL IMPRESSION / ASSESSMENT AND PLAN / ED COURSE  As part of my  medical decision making, I reviewed the following data within the electronic MEDICAL RECORD NUMBER Notes from prior ED visits and [place] Controlled Substance Database   This is a 52 year old female who comes into the hospital today with some chest pain.  The patient states it is been going on all day but it seems to get worse when she takes a deep breath.  My differential diagnosis includes gastritis, musculoskeletal pain, acute coronary syndrome, pulmonary embolus.  I did check some blood work to include a CBC BMP and troponin initially.  The patient had a repeat troponin which was negative.  When I did hear the story that it hurt to take a deep breath I did order a d-dimer.  The patient's d-dimer came back at 292.  The patient's chest x-ray was also negative.  I gave the patient a shot of Toradol and a GI cocktail.  I will have the patient follow-up with her primary care physician for further evaluation.  She still does need a stress test but I will also give her a prescription for Pepcid to help determine if the patient may be due to gastritis.  The patient will be discharged home.      ____________________________________________   FINAL CLINICAL IMPRESSION(S) / ED DIAGNOSES  Final diagnoses:  Chest pain, unspecified type     ED Discharge Orders        Ordered    famotidine (PEPCID) 40 MG tablet  Every evening     02/26/18 0636       Note:  This document was prepared using Dragon voice recognition software and may include unintentional dictation errors.    Loney Hering, MD 02/26/18 [phone]

## 2018-02-27 ENCOUNTER — Telehealth: Payer: Self-pay

## 2018-02-27 NOTE — Telephone Encounter (Signed)
Gastroenterology Pre-Procedure Review  Request Date: 05/24/18  Requesting Physician: Dr. Vicente Males  PATIENT REVIEW QUESTIONS: The patient responded to the following health history questions as indicated:    1. Are you having any GI issues? No  2. Do you have a personal history of Polyps? No  3. Do you have a family history of Colon Cancer or Polyps? Yes, Colon Ca Maternal Uncle age 51 4. Diabetes Mellitus? No  5. Joint replacements in the past 12 months? no 6. Major health problems in the past 3 months? No  7. Any artificial heart valves, MVP, or defibrillator? No     MEDICATIONS & ALLERGIES:    Patient reports the following regarding taking any anticoagulation/antiplatelet therapy:   Plavix, Coumadin, Eliquis, Xarelto, Lovenox, Pradaxa, Brilinta, or Effient? No  Aspirin? No   Patient confirms/reports the following medications:  Current Outpatient Medications  Medication Sig Dispense Refill  . famotidine (PEPCID) 40 MG tablet Take 1 tablet (40 mg total) by mouth every evening. 15 tablet 0  . sertraline (ZOLOFT) 100 MG tablet Take 100 mg by mouth daily.     No current facility-administered medications for this visit.     Patient confirms/reports the following allergies:  Allergies  Allergen Reactions  . Codeine Itching and Nausea And Vomiting  . Darvon [Propoxyphene] Itching and Nausea And Vomiting  . Sulfa Antibiotics   . Ultram [Tramadol Hcl] Itching and Nausea And Vomiting    No orders of the defined types were placed in this encounter.   AUTHORIZATION INFORMATION Primary Insurance: 1D#: Group #:  Secondary Insurance: 1D#: Group #:  SCHEDULE INFORMATION: Date: 05/24/18 Time: Location: [place]

## 2018-02-28 ENCOUNTER — Other Ambulatory Visit: Payer: BC Managed Care – PPO

## 2018-03-04 ENCOUNTER — Other Ambulatory Visit: Payer: Self-pay

## 2018-03-04 DIAGNOSIS — Z1211 Encounter for screening for malignant neoplasm of colon: Secondary | ICD-10-CM

## 2018-03-06 LAB — IGP, COBASHPV16/18
HPV 16: NEGATIVE
HPV 18: POSITIVE — AB
HPV OTHER HR TYPES: POSITIVE — AB
PAP SMEAR COMMENT: 0

## 2018-03-29 ENCOUNTER — Encounter: Payer: BC Managed Care – PPO | Admitting: Obstetrics and Gynecology

## 2018-05-23 ENCOUNTER — Telehealth: Payer: Self-pay | Admitting: Gastroenterology

## 2018-05-23 ENCOUNTER — Encounter: Payer: Self-pay | Admitting: Student

## 2018-05-23 NOTE — Telephone Encounter (Signed)
Pt is calling to cancel procedure for 05/24/18

## 2018-05-24 ENCOUNTER — Ambulatory Visit
Admission: RE | Admit: 2018-05-24 | Payer: BC Managed Care – PPO | Source: Ambulatory Visit | Admitting: Gastroenterology

## 2018-05-24 ENCOUNTER — Encounter: Admission: RE | Payer: Self-pay | Source: Ambulatory Visit

## 2018-05-24 SURGERY — COLONOSCOPY WITH PROPOFOL
Anesthesia: General

## 2018-11-07 ENCOUNTER — Encounter: Payer: BC Managed Care – PPO | Admitting: Obstetrics and Gynecology

## 2018-11-27 ENCOUNTER — Ambulatory Visit (INDEPENDENT_AMBULATORY_CARE_PROVIDER_SITE_OTHER): Payer: BC Managed Care – PPO | Admitting: Obstetrics and Gynecology

## 2018-11-27 ENCOUNTER — Encounter: Payer: Self-pay | Admitting: Obstetrics and Gynecology

## 2018-11-27 ENCOUNTER — Other Ambulatory Visit (HOSPITAL_COMMUNITY)
Admission: RE | Admit: 2018-11-27 | Discharge: 2018-11-27 | Disposition: A | Discharge status: Home / Self Care | Payer: BC Managed Care – PPO | Source: Ambulatory Visit | Attending: Obstetrics and Gynecology | Admitting: Obstetrics and Gynecology

## 2018-11-27 VITALS — BP 129/83 | HR 72 | Ht 60.0 in | Wt 140.9 lb

## 2018-11-27 DIAGNOSIS — N72 Inflammatory disease of cervix uteri: Secondary | ICD-10-CM

## 2018-11-27 DIAGNOSIS — R87612 Low grade squamous intraepithelial lesion on cytologic smear of cervix (LGSIL): Secondary | ICD-10-CM | POA: Insufficient documentation

## 2018-11-27 DIAGNOSIS — B977 Papillomavirus as the cause of diseases classified elsewhere: Secondary | ICD-10-CM

## 2018-11-27 NOTE — Progress Notes (Signed)
Chief complaint: 1.  Colposcopy 2.  ASCUS/positive Pap smear  52 year old female para 0, status post LSH, history of mild cervical dysplasia.  Patient reports no vaginal bleeding, vaginal discharge, or vaginal odor.  Cervical dysplasia history: Non-smoker 04/29/2015 colposcopy: Adequate; ECC-negative 01/27/2016 Pap smear-LGSIL 04/25/2016 colposcopy: Inadequate; ECC-negative; endocervical canal dilation with lacrimal duct probes is performed 01/23/2017 colposcopy: Inadequate; no biopsies; Pap smear LGSIL/negative 02/25/2018 Pap/HPV ASCUS/positive HPV 18/negative HPV 16/positive HPV other types 11/27/2018 colposcopy, inadequate; Pap/HPV obtained; no biopsies  OBJECTIVE: BP 129/83   Pulse 72   Ht 5' (1.524 m)   Wt 140 lb 14.4 oz (63.9 kg)   BMI 27.52 kg/m  Pleasant well-appearing female no acute distress.  Alert and oriented. 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 Findings: Inadequate colposcopy; no ectocervical abnormalities, no vaginal abnormalities Biopsies: None Pap smear: Obtained Description: Verbal consent is obtained.  Patient is placed in the dorsolithotomy position.  Graves speculum is used to facilitate visualization of the cervix and upper adjacent vagina.  The cervix and vagina is painted with Fox swabs soaked in acetic acid solution.  Colposcopy revealed no ectocervical abnormalities or vaginal abnormalities.  The SCJ is not visualized.  Due to the stenotic loss, and previous ECCs which have been negative, ECC is deferred.  ASSESSMENT: 1.  History of LGSIL Pap smears 2.  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  Inadequate colposcopy today  PLAN: 1.  Colposcopy as noted, without biopsies 2.  Pap/HPV 3.  Return in March 2019 for annual exam with Melody Theodoro Kalata, MD  Note: This dictation was prepared with Dragon dictation along with smaller phrase technology. Any transcriptional errors that result from this process are unintentional.

## 2018-11-27 NOTE — Patient Instructions (Signed)
1.  Pap smear is done today. 2.  Colposcopy is normal today with no evidence of dysplasia.  No biopsies were taken. 3.  Return in March 2020 for annual exam with [facility].

## 2018-12-05 LAB — CYTOLOGY - PAP
HPV 16/18/45 genotyping: NEGATIVE
HPV: DETECTED — AB

## 2019-02-27 ENCOUNTER — Encounter: Payer: BC Managed Care – PPO | Admitting: Obstetrics and Gynecology

## 2019-02-28 ENCOUNTER — Encounter: Payer: BC Managed Care – PPO | Admitting: Obstetrics and Gynecology

## 2019-03-07 ENCOUNTER — Encounter: Payer: Self-pay | Admitting: Obstetrics and Gynecology

## 2019-03-07 ENCOUNTER — Ambulatory Visit (INDEPENDENT_AMBULATORY_CARE_PROVIDER_SITE_OTHER): Payer: BC Managed Care – PPO | Admitting: Obstetrics and Gynecology

## 2019-03-07 ENCOUNTER — Other Ambulatory Visit (HOSPITAL_COMMUNITY)
Admission: RE | Admit: 2019-03-07 | Discharge: 2019-03-07 | Disposition: A | Discharge status: Home / Self Care | Payer: BC Managed Care – PPO | Source: Ambulatory Visit | Attending: Obstetrics and Gynecology | Admitting: Obstetrics and Gynecology

## 2019-03-07 ENCOUNTER — Other Ambulatory Visit: Payer: Self-pay

## 2019-03-07 VITALS — BP 127/82 | HR 76 | Ht 60.0 in | Wt 146.2 lb

## 2019-03-07 DIAGNOSIS — Z1239 Encounter for other screening for malignant neoplasm of breast: Secondary | ICD-10-CM

## 2019-03-07 DIAGNOSIS — Z1211 Encounter for screening for malignant neoplasm of colon: Secondary | ICD-10-CM | POA: Diagnosis not present

## 2019-03-07 DIAGNOSIS — Z01419 Encounter for gynecological examination (general) (routine) without abnormal findings: Secondary | ICD-10-CM | POA: Diagnosis not present

## 2019-03-07 DIAGNOSIS — R87612 Low grade squamous intraepithelial lesion on cytologic smear of cervix (LGSIL): Secondary | ICD-10-CM | POA: Diagnosis not present

## 2019-03-07 DIAGNOSIS — Z90711 Acquired absence of uterus with remaining cervical stump: Secondary | ICD-10-CM

## 2019-03-07 NOTE — Progress Notes (Signed)
Subjective:   LYNNEL ZANETTI is a 53 y.o. G0P0000 Caucasian female here for a routine well-woman exam.  No LMP recorded. Patient has had a hysterectomy.    Current complaints: frequent BMs and inability to hold it.  Has average of 2 BMs a day. No blood in stools  Worse when stress. Still feels like SSRI & SNRI are still working fine. States a lot of stress for the last few years.  PCP: Baldemar Lenis       Does need labs  Social History: Sexual: heterosexual Marital Status: married Living situation: with spouse Occupation: Education officer, community at [employer] Tobacco/alcohol: no tobacco use Illicit drugs: no history of illicit drug use  The following portions of the patient's history were reviewed and updated as appropriate: allergies, current medications, past family history, past medical history, past social history, past surgical history and problem list.  Past Medical History Past Medical History:  Diagnosis Date  . Anxiety   . Depression   . Fibroids    h/o  . Heavy periods   . Hyperlipemia   . Painful menstrual periods   . RLS (restless legs syndrome)   . 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 ABDOMINAL HYSTERECTOMY  2006   lsh  . colposcopy    . INNER EAR SURGERY  [id number]    Gynecologic History G0P0000  No LMP recorded. Patient has had a hysterectomy. Contraception: status post hysterectomy Last Pap: 11/2018. Results were: abnormal Last mammogram: 01/2017. Results were: normal   Obstetric History OB History  Gravida Para Term Preterm AB Living  0 0 0 0 0 0  SAB TAB Ectopic Multiple Live Births  0 0 0 0      Current Medications Current Outpatient Medications on File Prior to Visit  Medication Sig Dispense Refill  . buPROPion (WELLBUTRIN) 100 MG tablet TAKE 1 TABLET BY MOUTH TWICE A DAY AT 8AM AND 3PM    . CVS VITAMIN B12 1000 MCG tablet Take 1,000 mcg by mouth daily.    Marland Kitchen  lisinopril-hydrochlorothiazide (PRINZIDE,ZESTORETIC) 10-12.5 MG tablet Take by mouth.    . sertraline (ZOLOFT) 100 MG tablet Take 100 mg by mouth daily.     No current facility-administered medications on file prior to visit.     Review of Systems Patient denies any headaches, blurred vision, shortness of breath, chest pain, abdominal pain, problems with bowel movements, urination, or intercourse.  Objective:  BP 127/82   Pulse 76   Ht 5' (1.524 m)   Wt 146 lb 3.2 oz (66.3 kg)   BMI 28.55 kg/m  Physical Exam  General:  Well developed, well nourished, no acute distress. She is alert and oriented x3. Skin:  Warm and dry Neck:  Midline trachea, no thyromegaly or nodules Cardiovascular: Regular rate and rhythm, no murmur heard Lungs:  Effort normal, all lung fields clear to auscultation bilaterally Breasts:  No dominant palpable mass, retraction, or nipple discharge Abdomen:  Soft, non tender, no hepatosplenomegaly or masses Pelvic:  External genitalia is normal in appearance.  The vagina is normal in appearance. The cervix is bulbous, no CMT.  Thin prep pap is done with HR HPV cotesting. Uterus is surgically absent No adnexal masses or tenderness noted. Extremities:  No swelling or varicosities noted Psych:  She has a normal mood and affect  Assessment:   Healthy well-woman exam Abnormal pap BMI 28.55 Anxiety on SSRI HTN  Plan:  Labs obtained- will follow up accordingly F/U 6 months for repeat pap, or sooner if needed Mammogram ordered or sooner if problems Colonoscopy referred to GI   Rockney Ghee, CNM

## 2019-03-08 LAB — COMPREHENSIVE METABOLIC PANEL
A/G RATIO: 2.1 (ref 1.2–2.2)
ALK PHOS: 98 IU/L (ref 39–117)
ALT: 35 IU/L — ABNORMAL HIGH (ref 0–32)
AST: 24 IU/L (ref 0–40)
Albumin: 4.6 g/dL (ref 3.8–4.9)
BILIRUBIN TOTAL: 0.7 mg/dL (ref 0.0–1.2)
BUN/Creatinine Ratio: 19 (ref 9–23)
BUN: 14 mg/dL (ref 6–24)
CHLORIDE: 100 mmol/L (ref 96–106)
CO2: 23 mmol/L (ref 20–29)
Calcium: 9.7 mg/dL (ref 8.7–10.2)
Creatinine, Ser: 0.72 mg/dL (ref 0.57–1.00)
GFR calc Af Amer: 111 mL/min/{1.73_m2} (ref >59)
GFR, EST NON AFRICAN AMERICAN: 97 mL/min/{1.73_m2} (ref >59)
GLOBULIN, TOTAL: 2.2 g/dL (ref 1.5–4.5)
Glucose: 107 mg/dL — ABNORMAL HIGH (ref 65–99)
POTASSIUM: 4.3 mmol/L (ref 3.5–5.2)
SODIUM: 138 mmol/L (ref 134–144)
Total Protein: 6.8 g/dL (ref 6.0–8.5)

## 2019-03-08 LAB — CBC
HEMATOCRIT: 41 % (ref 34.0–46.6)
Hemoglobin: 14.6 g/dL (ref 11.1–15.9)
MCH: 32 pg (ref 26.6–33.0)
MCHC: 35.6 g/dL (ref 31.5–35.7)
MCV: 90 fL (ref 79–97)
Platelets: 204 10*3/uL (ref 150–450)
RBC: 4.56 x10E6/uL (ref 3.77–5.28)
RDW: 12.7 % (ref 11.7–15.4)
WBC: 4.4 10*3/uL (ref 3.4–10.8)

## 2019-03-08 LAB — LIPID PANEL
Chol/HDL Ratio: 7.5 ratio — ABNORMAL HIGH (ref 0.0–4.4)
Cholesterol, Total: 332 mg/dL — ABNORMAL HIGH (ref 100–199)
HDL: 44 mg/dL (ref >39)
LDL CALC: 253 mg/dL — AB (ref 0–99)
TRIGLYCERIDES: 177 mg/dL — AB (ref 0–149)
VLDL CHOLESTEROL CAL: 35 mg/dL (ref 5–40)

## 2019-03-08 LAB — HEMOGLOBIN A1C
Est. average glucose Bld gHb Est-mCnc: 108 mg/dL
HEMOGLOBIN A1C: 5.4 % (ref 4.8–5.6)

## 2019-03-08 LAB — THYROID PANEL WITH TSH
Free Thyroxine Index: 1.5 (ref 1.2–4.9)
T3 Uptake Ratio: 19 % — ABNORMAL LOW (ref 24–39)
T4, Total: 7.9 ug/dL (ref 4.5–12.0)
TSH: 2.46 u[IU]/mL (ref 0.450–4.500)

## 2019-03-08 LAB — VITAMIN D 25 HYDROXY (VIT D DEFICIENCY, FRACTURES): Vit D, 25-Hydroxy: 8.6 ng/mL — ABNORMAL LOW (ref 30.0–100.0)

## 2019-03-12 ENCOUNTER — Telehealth: Payer: Self-pay

## 2019-03-12 ENCOUNTER — Other Ambulatory Visit: Payer: Self-pay | Admitting: Obstetrics and Gynecology

## 2019-03-12 DIAGNOSIS — E559 Vitamin D deficiency, unspecified: Secondary | ICD-10-CM | POA: Insufficient documentation

## 2019-03-12 DIAGNOSIS — E7800 Pure hypercholesterolemia, unspecified: Secondary | ICD-10-CM | POA: Insufficient documentation

## 2019-03-12 MED ORDER — VITAMIN D (ERGOCALCIFEROL) 1.25 MG (50000 UNIT) PO CAPS: 50000.0000 [IU] | ORAL_CAPSULE | ORAL | 2 refills | Status: AC

## 2019-03-12 NOTE — Telephone Encounter (Signed)
LVM to inform patient her referral has been received for screening colonoscopy and once restrictions have been lifted we will call her back to schedule.  Thanks [employer]

## 2019-03-14 LAB — CYTOLOGY - PAP
DIAGNOSIS: UNDETERMINED — AB
HPV (WINDOPATH): DETECTED — AB
HPV 16/18/45 GENOTYPING: NEGATIVE

## 2019-04-15 ENCOUNTER — Other Ambulatory Visit: Payer: Self-pay

## 2019-04-15 ENCOUNTER — Ambulatory Visit (INDEPENDENT_AMBULATORY_CARE_PROVIDER_SITE_OTHER): Payer: BC Managed Care – PPO | Admitting: Gastroenterology

## 2019-04-15 DIAGNOSIS — R140 Abdominal distension (gaseous): Secondary | ICD-10-CM

## 2019-04-15 DIAGNOSIS — R197 Diarrhea, unspecified: Secondary | ICD-10-CM | POA: Diagnosis not present

## 2019-04-15 NOTE — Progress Notes (Signed)
Julia Edwards [address]  [place], [place] [address]  Main: [phone]  Fax: [phone]   Gastroenterology Consultation  Referring Provider:     Joylene Igo, CNM Primary Care Physician:  Derinda Late, MD Reason for Consultation:    Altered bowel habits, colon cancer screening        HPI:   Virtual Visit via Video Note  I connected with patient on 04/15/19 at 10:30 AM EDT by video ([email or web address]) and verified that I am speaking with the correct person using two identifiers.   I discussed the limitations, risks, security and privacy concerns of performing an evaluation and management service by video and the availability of in person appointments. I also discussed with the patient that there may be a patient responsible charge related to this service. The patient expressed understanding and agreed to proceed.  Location of the patient: Home Location of provider: Home Participating persons: Patient and provider only (Nursing staff checked in patient via phone but were not physically involved in the video interaction - see their notes)   History of Present Illness: Chief Complaint  Patient presents with   New Patient (Initial Visit)    DIARRHEA    Julia Edwards is a 53 y.o. y/o female referred for consultation & management  by Dr. Derinda Late, MD.  Patient reports 1 year history of intermittent fecal incontinence and loose stools.  Reports 2-3 loose stools a day, and fecal incontinence if she does not make it to the bathroom right away.  States this is new for her for the past year.  No blood in stool.  No nausea vomiting or abdominal pain.  No weight loss.  No medication or bowel changes.  States first she thought it was related to stress, but is occurring even when she is not stressed.  Does not wake up at night to have a bowel movement.  Has an uncle with colon cancer but no immediate family members with colon cancer.  No prior  colonoscopies.  No family members with IBD.  Also reports intermittent abdominal bloating.  Does state the loose bowel movements occur while or right after eating.  No significant diet triggers.  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 Authorizing Provider  buPROPion (WELLBUTRIN) 100 MG tablet TAKE 1 TABLET BY MOUTH TWICE A DAY AT 8AM AND 3PM 05/24/18   [provider]  CVS VITAMIN B12 1000 MCG tablet Take 1,000 mcg by mouth daily. 01/16/19   [provider]  lisinopril-hydrochlorothiazide (PRINZIDE,ZESTORETIC) 10-12.5 MG tablet Take by mouth. 03/29/18 03/29/19  [provider]  sertraline (ZOLOFT) 100 MG tablet Take 100 mg by mouth daily.    [provider]  Vitamin D, Ergocalciferol, (DRISDOL) 1.25 MG (50000 UT) CAPS capsule Take 1 capsule (50,000 Units total) by mouth 2 (two) times a week. 03/13/19   Joylene Igo, CNM    Family History  Problem Relation  Age of Onset   COPD Mother    Lung cancer Father    Heart disease Maternal Grandmother    Heart disease Maternal Grandfather    Heart disease Paternal Grandmother    Heart disease Paternal Grandfather    Breast cancer Maternal Aunt 59   Colon cancer Maternal Uncle 31   Breast cancer Other        cousin   Cancer Neg Hx    Diabetes Neg Hx      Social History   Tobacco Use   Smoking status: Never Smoker   Smokeless tobacco: Never Used  Substance Use Topics   Alcohol use: Yes    Comment: occas   Drug use: No    Allergies as of 04/15/2019 - Review  Complete 03/07/2019  Allergen Reaction Noted   Codeine Itching and Nausea And Vomiting 09/20/2015   Darvon [propoxyphene] Itching and Nausea And Vomiting 09/20/2015   Sulfa antibiotics  09/21/2015   Ultram [tramadol hcl] Itching and Nausea And Vomiting 09/20/2015    Review of Systems:    All systems reviewed and negative except where noted in HPI.   Observations/Objective:  Labs: CBC    Component Value Date/Time   WBC 4.4 03/07/2019 1002   WBC 6.5 02/25/2018 2324   RBC 4.56 03/07/2019 1002   RBC 4.40 02/25/2018 2324   HGB 14.6 03/07/2019 1002   HCT 41.0 03/07/2019 1002   PLT 204 03/07/2019 1002   MCV 90 03/07/2019 1002   MCH 32.0 03/07/2019 1002   MCH 31.2 02/25/2018 2324   MCHC 35.6 03/07/2019 1002   MCHC 34.4 02/25/2018 2324   RDW 12.7 03/07/2019 1002   CMP     Component Value Date/Time   NA 138 03/07/2019 1002   K 4.3 03/07/2019 1002   CL 100 03/07/2019 1002   CO2 23 03/07/2019 1002   GLUCOSE 107 (H) 03/07/2019 1002   GLUCOSE 111 (H) 02/25/2018 2324   BUN 14 03/07/2019 1002   CREATININE 0.72 03/07/2019 1002   CALCIUM 9.7 03/07/2019 1002   PROT 6.8 03/07/2019 1002   ALBUMIN 4.6 03/07/2019 1002   AST 24 03/07/2019 1002   ALT 35 (H) 03/07/2019 1002   ALKPHOS 98 03/07/2019 1002   BILITOT 0.7 03/07/2019 1002   GFRNONAA 97 03/07/2019 1002   GFRAA 111 03/07/2019 1002    Imaging Studies: No results found.  Assessment and Plan:   [name] is a 53 y.o. y/o female has been referred for altered bowel habits  Assessment and Plan: At this time differential is wide IBS, versus fecal incontinence due to pelvic floor dysfunction, versus IBD, versus celiac disease  No weight loss or blood in stool is reassuring.  No alarm features present. However, patient has never had a colonoscopy and is due for screening colonoscopy and given her altered bowel habits, colonoscopy for further evaluation is indicated.  Due to the current COVID-19 situation,  elective procedures are currently being scheduled for a later time as per nationwide recommendations. Therefore, the patient was informed of the need to schedule the procedure in the upcoming months, instead of sooner, since this is an elective procedure. Patient will need to be contacted at a later time to place him/her on the schedule. However, alarm symptoms were discussed in detail, and if these occur pt was advised to call us to discuss change in symptoms and evaluation for a more urgent procedure if appropriate. No indication for urgent procedure exist at this time. Patient was given the contact  information to reach [place] with any questions, concerns, or change in symptoms.   Will start with stool studies and blood work to rule out anemia, iron deficiency, evaluate inflammatory markers, fecal calprotectin, H. pylori testing.  if the above blood work is abnormal, patient may need endoscopic evaluation sooner than later  Use Metamucil to help bulk stool and help with fecal incontinence  Follow Up Instructions: Clinic follow-up in 4 to 6 weeks to reassess symptoms and above work-up  I discussed the assessment and treatment plan with the patient. The patient was provided an opportunity to ask questions and all were answered. The patient agreed with the plan and demonstrated an understanding of the instructions.   The patient was advised to call back or seek an in-person evaluation if the symptoms worsen or if the condition fails to improve as anticipated.  I provided 30 minutes of face-to-face time via video software during this encounter.   Virgel Manifold, MD  Speech recognition software was used to dictate the above note.

## 2019-04-15 NOTE — Progress Notes (Signed)
Patient ID: Julia Edwards, female   DOB: 02-23-1966, 53 y.o.   MRN: [id number] Left detailed message on voice mail where to go to get labs done with [facility] ([address] and instructions for h. Pylori breath test, no gum, no liquids, etc. 1 hr. Prior to test, and will need to obtain containers for stool samples also. To contact office if needed.

## 2019-04-19 LAB — CELIAC PANEL 10
Deamidated Gliadin Abs, IgA: 12 U (ref 0–19)
Deamidated Gliadin Abs, IgG: 2 U (ref 0–19)
Endomysial IgA: NEGATIVE
Immunoglobulin A, (IgA) QN, Serum: 247 mg/dL (ref 87–352)
t-Transglutaminase (tTG) IgA: <2 U/mL (ref 0–3)
t-Transglutaminase (tTG) IgG: 3 U/mL (ref 0–5)

## 2019-04-19 LAB — SEDIMENTATION RATE: Sed Rate: 30 mm/h (ref 0–40)

## 2019-04-19 LAB — CBC
Hematocrit: 39.4 % (ref 34.0–46.6)
Hemoglobin: 14.1 g/dL (ref 11.1–15.9)
MCH: 31.5 pg (ref 26.6–33.0)
MCHC: 35.8 g/dL — ABNORMAL HIGH (ref 31.5–35.7)
MCV: 88 fL (ref 79–97)
Platelets: 224 x10E3/uL (ref 150–450)
RBC: 4.48 x10E6/uL (ref 3.77–5.28)
RDW: 12.8 % (ref 11.7–15.4)
WBC: 4.8 x10E3/uL (ref 3.4–10.8)

## 2019-04-19 LAB — C-REACTIVE PROTEIN: CRP: 4 mg/L (ref 0–10)

## 2019-04-19 LAB — H. PYLORI BREATH TEST: H pylori Breath Test: NEGATIVE

## 2019-04-19 LAB — FERRITIN: Ferritin: 108 ng/mL (ref 15–150)

## 2019-05-15 ENCOUNTER — Ambulatory Visit: Payer: BC Managed Care – PPO | Admitting: Gastroenterology

## 2019-05-26 ENCOUNTER — Telehealth: Payer: Self-pay

## 2019-05-26 ENCOUNTER — Other Ambulatory Visit: Payer: Self-pay

## 2019-05-26 ENCOUNTER — Ambulatory Visit (INDEPENDENT_AMBULATORY_CARE_PROVIDER_SITE_OTHER): Payer: BC Managed Care – PPO | Admitting: Gastroenterology

## 2019-05-26 DIAGNOSIS — Z1211 Encounter for screening for malignant neoplasm of colon: Secondary | ICD-10-CM

## 2019-05-26 DIAGNOSIS — R195 Other fecal abnormalities: Secondary | ICD-10-CM | POA: Diagnosis not present

## 2019-05-26 NOTE — Telephone Encounter (Signed)
Left message for pt to contact office to schedule procedure (colonoscopy) for screening.

## 2019-05-26 NOTE — Progress Notes (Signed)
Julia Antigua, MD [address]  [place]  [place], [place] [address]  Main: [phone]  Fax: [phone]   Primary Care Physician: Julia Late, MD  Virtual Visit via Video Note  I connected with patient on 05/26/19 at  9:00 AM EDT by video (using [email or web address]) and verified that I am speaking with the correct person using two identifiers.   I discussed the limitations, risks, security and privacy concerns of performing an evaluation and management service by video and the availability of in person appointments. I also discussed with the patient that there may be a patient responsible charge related to this service. The patient expressed understanding and agreed to proceed.  Location of Patient: Home Location of Provider: Home Persons involved: Patient and provider only (Nursing staff checked in patient via phone but were not physically involved in the video interaction - see their notes)   History of Present Illness: Chief Complaint  Patient presents with  . Follow-up    diarrhea    HPI: Julia Edwards is a 53 y.o. female here for follow-up of loose stools.  Patient states compared to her last visit her symptoms are much better.  She states she has been eating a healthier diet and avoiding processed foods.  This has helped both with her bloating and loose stools.  She still feels urgency at times when she is out of the house but overall her symptoms have improved.  No abdominal pain.  No dysphagia, no nausea or vomiting, no heartburn.  Current Outpatient Medications  Medication Sig Dispense Refill  . buPROPion (WELLBUTRIN) 100 MG tablet TAKE 1 TABLET BY MOUTH TWICE A DAY AT 8AM AND 3PM    . CVS VITAMIN B12 1000 MCG tablet Take 1,000 mcg by mouth daily.    Marland Kitchen lisinopril-hydrochlorothiazide (PRINZIDE,ZESTORETIC) 10-12.5 MG tablet Take by mouth.    . sertraline (ZOLOFT) 100 MG tablet Take 100 mg by mouth daily.    . Vitamin D, Ergocalciferol, (DRISDOL) 1.25 MG  (50000 UT) CAPS capsule Take 1 capsule (50,000 Units total) by mouth 2 (two) times a week. 30 capsule 2   No current facility-administered medications for this visit.     Allergies as of 05/26/2019 - Review Complete 03/07/2019  Allergen Reaction Noted  . Codeine Itching and Nausea And Vomiting 09/20/2015  . Darvon [propoxyphene] Itching and Nausea And Vomiting 09/20/2015  . Sulfa antibiotics  09/21/2015  . Ultram [tramadol hcl] Itching and Nausea And Vomiting 09/20/2015    Review of Systems:    All systems reviewed and negative except where noted in HPI.   Observations/Objective:  Labs: CMP     Component Value Date/Time   NA 138 03/07/2019 1002   K 4.3 03/07/2019 1002   CL 100 03/07/2019 1002   CO2 23 03/07/2019 1002   GLUCOSE 107 (H) 03/07/2019 1002   GLUCOSE 111 (H) 02/25/2018 2324   BUN 14 03/07/2019 1002   CREATININE 0.72 03/07/2019 1002   CALCIUM 9.7 03/07/2019 1002   PROT 6.8 03/07/2019 1002   ALBUMIN 4.6 03/07/2019 1002   AST 24 03/07/2019 1002   ALT 35 (H) 03/07/2019 1002   ALKPHOS 98 03/07/2019 1002   BILITOT 0.7 03/07/2019 1002   GFRNONAA 97 03/07/2019 1002   GFRAA 111 03/07/2019 1002   Lab Results  Component Value Date   WBC 4.8 04/17/2019   HGB 14.1 04/17/2019   HCT 39.4 04/17/2019   MCV 88 04/17/2019   PLT 224 04/17/2019    Imaging Studies: No  results found.  Assessment and Plan:   Julia Edwards is a 53 y.o. y/o female with previous history of loose stools which have now improved with diet changes  Assessment and Plan: Patient attributes her intermittent loose stools to increased stressors at times However, her bloating is resolved and her loose stools are better with diet changes  She is due for screening colonoscopy which would also allow [place] to obtain biopsies for microscopic colitis and rule out any other underlying lesions  I have discussed alternative options, risks & benefits,  which include, but are not limited to, bleeding,  infection, perforation,respiratory complication & drug reaction.  The patient agrees with this plan & written consent will be obtained.    I also discussed that given her previous bloating if she is interested in an upper endoscopy for evaluation of her upper GI tract.  Since her symptoms are much better, she is not interested at this time.  Her H. pylori testing was also negative.  Follow Up Instructions: Follow-up in 6 months   I discussed the assessment and treatment plan with the patient. The patient was provided an opportunity to ask questions and all were answered. The patient agreed with the plan and demonstrated an understanding of the instructions.   The patient was advised to call back or seek an in-person evaluation if the symptoms worsen or if the condition fails to improve as anticipated.  I provided 15 minutes of face-to-face time via video software during this encounter. Additional time was spent in reviewing patient's chart, placing orders etc.   Julia Manifold, MD  Speech recognition software was used to dictate this note.

## 2019-06-12 NOTE — Telephone Encounter (Signed)
Patient l/m she was returning call to schedule a colonoscopy.

## 2019-06-17 ENCOUNTER — Other Ambulatory Visit: Payer: Self-pay

## 2019-06-17 DIAGNOSIS — Z1211 Encounter for screening for malignant neoplasm of colon: Secondary | ICD-10-CM

## 2019-06-17 MED ORDER — NA SULFATE-K SULFATE-MG SULF 17.5-3.13-1.6 GM/177ML PO SOLN: 1.0000 | Freq: Once | ORAL | 0 refills | Status: AC

## 2019-06-17 NOTE — Telephone Encounter (Signed)
Patient calling to schedule colonoscopy. She would like last week of July or 1st week in [place] prefers August.

## 2019-06-17 NOTE — Telephone Encounter (Signed)
Left message to contact office

## 2019-06-17 NOTE — Telephone Encounter (Signed)
Pt scheduled colonoscopy for 07/25/2019 at [facility]. [facility]-between 10:30 am and 12:30 pm. Will send prep instructions via my chart and resend Rx for prep. Pt unsure whether or not she got it before.

## 2019-06-17 NOTE — Telephone Encounter (Signed)
Pt left vm for Debbie to schedule a colonoscopy

## 2019-07-21 ENCOUNTER — Other Ambulatory Visit: Payer: Self-pay

## 2019-07-21 MED ORDER — SUPREP BOWEL PREP KIT 17.5-3.13-1.6 GM/177ML PO SOLN: 1.0000 | ORAL | 0 refills | Status: AC

## 2019-07-22 ENCOUNTER — Other Ambulatory Visit
Admission: RE | Admit: 2019-07-22 | Discharge: 2019-07-22 | Disposition: A | Discharge status: Home / Self Care | Payer: BC Managed Care – PPO | Source: Ambulatory Visit | Attending: Gastroenterology | Admitting: Gastroenterology

## 2019-07-22 ENCOUNTER — Other Ambulatory Visit: Payer: Self-pay

## 2019-07-22 DIAGNOSIS — Z01812 Encounter for preprocedural laboratory examination: Secondary | ICD-10-CM | POA: Diagnosis present

## 2019-07-22 DIAGNOSIS — K635 Polyp of colon: Secondary | ICD-10-CM | POA: Insufficient documentation

## 2019-07-22 DIAGNOSIS — Z20828 Contact with and (suspected) exposure to other viral communicable diseases: Secondary | ICD-10-CM | POA: Insufficient documentation

## 2019-07-22 LAB — SARS CORONAVIRUS 2 (TAT 6-24 HRS): SARS Coronavirus 2: NEGATIVE

## 2019-07-25 ENCOUNTER — Other Ambulatory Visit: Payer: Self-pay

## 2019-07-25 ENCOUNTER — Ambulatory Visit: Payer: BC Managed Care – PPO | Admitting: Anesthesiology

## 2019-07-25 ENCOUNTER — Encounter
Admission: RE | Disposition: A | Discharge status: Home / Self Care | Payer: Self-pay | Source: Home / Self Care | Attending: Gastroenterology

## 2019-07-25 ENCOUNTER — Ambulatory Visit
Admission: RE | Admit: 2019-07-25 | Discharge: 2019-07-25 | Disposition: A | Discharge status: Home / Self Care | Payer: BC Managed Care – PPO | Attending: Gastroenterology | Admitting: Gastroenterology

## 2019-07-25 DIAGNOSIS — D124 Benign neoplasm of descending colon: Secondary | ICD-10-CM | POA: Insufficient documentation

## 2019-07-25 DIAGNOSIS — I10 Essential (primary) hypertension: Secondary | ICD-10-CM | POA: Diagnosis not present

## 2019-07-25 DIAGNOSIS — D123 Benign neoplasm of transverse colon: Secondary | ICD-10-CM | POA: Diagnosis not present

## 2019-07-25 DIAGNOSIS — Z79899 Other long term (current) drug therapy: Secondary | ICD-10-CM | POA: Insufficient documentation

## 2019-07-25 DIAGNOSIS — D125 Benign neoplasm of sigmoid colon: Secondary | ICD-10-CM | POA: Insufficient documentation

## 2019-07-25 DIAGNOSIS — E785 Hyperlipidemia, unspecified: Secondary | ICD-10-CM | POA: Diagnosis not present

## 2019-07-25 DIAGNOSIS — F419 Anxiety disorder, unspecified: Secondary | ICD-10-CM | POA: Insufficient documentation

## 2019-07-25 DIAGNOSIS — F329 Major depressive disorder, single episode, unspecified: Secondary | ICD-10-CM | POA: Diagnosis not present

## 2019-07-25 DIAGNOSIS — Z1211 Encounter for screening for malignant neoplasm of colon: Secondary | ICD-10-CM | POA: Diagnosis not present

## 2019-07-25 DIAGNOSIS — K635 Polyp of colon: Secondary | ICD-10-CM

## 2019-07-25 DIAGNOSIS — K5730 Diverticulosis of large intestine without perforation or abscess without bleeding: Secondary | ICD-10-CM | POA: Diagnosis not present

## 2019-07-25 HISTORY — DX: Essential (primary) hypertension: I10

## 2019-07-25 HISTORY — PX: COLONOSCOPY WITH PROPOFOL: SHX5780

## 2019-07-25 HISTORY — DX: Unspecified hearing loss, bilateral: H91.93

## 2019-07-25 SURGERY — COLONOSCOPY WITH PROPOFOL
Anesthesia: General

## 2019-07-25 MED ORDER — PROPOFOL 10 MG/ML IV BOLUS
INTRAVENOUS | Status: DC | PRN
  Administered 2019-07-25 (×10): 50 mg via INTRAVENOUS

## 2019-07-25 MED ORDER — PROPOFOL 500 MG/50ML IV EMUL
INTRAVENOUS | Status: DC | PRN
  Administered 2019-07-25: 150 ug/kg/min via INTRAVENOUS

## 2019-07-25 MED ORDER — SODIUM CHLORIDE (PF) 0.9 % IJ SOLN
INTRAMUSCULAR | Status: DC | PRN
  Administered 2019-07-25: 8 mL via INTRAVENOUS

## 2019-07-25 MED ORDER — SODIUM CHLORIDE 0.9 % IV SOLN
INTRAVENOUS | Status: DC
  Administered 2019-07-25: 09:00:00 via INTRAVENOUS

## 2019-07-25 NOTE — Op Note (Signed)
[facility] Gastroenterology Patient Name: Laiya Wisby Procedure Date: 07/25/2019 9:23 AM MRN: [id number] Account #: [id number] Date of Birth: [birth year] Admit Type: Outpatient Age: 53 Room: [facility] ENDO ROOM 1 Gender: Female Note Status: Finalized Procedure:            Colonoscopy Indications:          Screening for colorectal malignant neoplasm Providers:            Trigger Frasier B. Bonna Gains MD, MD Referring MD:         Caprice Renshaw MD (Referring MD) Medicines:            Monitored Anesthesia Care Complications:        No immediate complications. Procedure:            Pre-Anesthesia Assessment:                       - ASA Grade Assessment: II - A patient with mild                        systemic disease.                       - Prior to the procedure, a History and Physical was                        performed, and patient medications, allergies and                        sensitivities were reviewed. The patient's tolerance of                        previous anesthesia was reviewed.                       - The risks and benefits of the procedure and the                        sedation options and risks were discussed with the                        patient. All questions were answered and informed                        consent was obtained.                       - Patient identification and proposed procedure were                        verified prior to the procedure by the physician, the                        nurse, the anesthesiologist, the anesthetist and the                        technician. The procedure was verified in the procedure                        room.  After obtaining informed consent, the colonoscope was                        passed under direct vision. Throughout the procedure,                        the patient's blood pressure, pulse, and oxygen                        saturations were monitored continuously. The                         Colonoscope was introduced through the anus and                        advanced to the the cecum, identified by appendiceal                        orifice and ileocecal valve. The colonoscopy was                        performed with ease. The patient tolerated the                        procedure well. The quality of the bowel preparation                        was good. Findings:      The perianal and digital rectal examinations were normal.      A 12 mm polyp was found in the ascending colon. The polyp was flat. Area       was successfully injected with saline for lesion assessment, and this       injection appeared to lift the lesion adequately. The polyp was removed       with a piecemeal technique using a cold snare. Resection and retrieval       were complete using a suction (via the working channel).      A 11 mm polyp was found in the ascending colon. The polyp was flat. Area       was successfully injected with saline for lesion assessment, and this       injection appeared to lift the lesion adequately. The polyp was removed       with a piecemeal technique using a cold snare. Resection and retrieval       were complete using a suction (via the working channel). Both the 12 mm       and 11 mm polyps in the ascending colon were very flat. Pt was fast       asleep with the sedation but had a lot of body and leg movement       throughout the procedure which moved the snare multiple times.       Therefore, a piecemeal technique was used to remove both the above       polyps.      Three sessile polyps were found in the sigmoid colon, descending colon       and transverse colon. The polyps were 3 to 4 mm in size. These polyps       were removed with a cold biopsy forceps. Resection and retrieval were       complete.  Multiple diverticula were found in the sigmoid colon.      The exam was otherwise without abnormality.      The rectum, sigmoid colon,  descending colon, transverse colon, ascending       colon and cecum appeared normal. Biopsies for histology were taken with       a cold forceps from the cecum, ascending colon, transverse colon and       descending colon for evaluation of microscopic colitis.      Retroflexion in the rectum was not performed due to Narrow rectum.       Careful frontal view of the rectum was otherwise normal. Impression:           - One 12 mm polyp in the ascending colon, removed                        piecemeal using a cold snare. Resected and retrieved.                        Injected.                       - One 11 mm polyp in the ascending colon, removed                        piecemeal using a cold snare. Resected and retrieved.                        Injected.                       - Three 3 to 4 mm polyps in the sigmoid colon, in the                        descending colon and in the transverse colon, removed                        with a cold biopsy forceps. Resected and retrieved.                       - Diverticulosis in the sigmoid colon.                       - The examination was otherwise normal.                       - The rectum, sigmoid colon, descending colon,                        transverse colon, ascending colon and cecum are normal.                        Biopsied. Recommendation:       - Discharge patient to home (with escort).                       - Advance diet as tolerated.                       - Continue present medications.                       -  Await pathology results.                       - Repeat colonoscopy date to be determined after                        pending pathology results are reviewed.                       - The findings and recommendations were discussed with                        the patient.                       - The findings and recommendations were discussed with                        the patient's family.                       - Return to  primary care physician as previously                        scheduled.                       - High fiber diet. Procedure Code(s):    --- Professional ---                       [phone], Colonoscopy, flexible; with removal of tumor(s),                        polyp(s), or other lesion(s) by snare technique                       45381, Colonoscopy, flexible; with directed submucosal                        injection(s), any substance                       32951, 25, Colonoscopy, flexible; with biopsy, single                        or multiple Diagnosis Code(s):    --- Professional ---                       K63.5, Polyp of colon                       Z12.11, Encounter for screening for malignant neoplasm                        of colon                       K57.30, Diverticulosis of large intestine without                        perforation or abscess without bleeding CPT copyright 2019 American [employer]. All rights reserved. The codes documented in this report are preliminary and upon coder review may  be revised to meet current  compliance requirements.  [name], MD Margretta Sidle B. Bonna Gains MD, MD 07/25/2019 10:39:47 AM This report has been signed electronically. Number of Addenda: 0 Note Initiated On: 07/25/2019 9:23 AM Scope Withdrawal Time: 0 hours 44 minutes 11 seconds  Total Procedure Duration: 0 hours 49 minutes 52 seconds  Estimated Blood Loss: Estimated blood loss: none.      [facility]

## 2019-07-25 NOTE — Transfer of Care (Signed)
Immediate Anesthesia Transfer of Care Note  Patient: Julia Edwards  Procedure(s) Performed: COLONOSCOPY WITH PROPOFOL (N/A )  Patient Location: Endoscopy Unit  Anesthesia Type:General  Level of Consciousness: drowsy, patient cooperative and responds to stimulation  Airway & Oxygen Therapy: Patient Spontanous Breathing and Patient connected to face mask oxygen  Post-op Assessment: Report given to RN and Post -op Vital signs reviewed and stable  Post vital signs: Reviewed and stable  Last Vitals:  Vitals Value Taken Time  BP 105/70 07/25/19 1030  Temp    Pulse 87 07/25/19 1030  Resp 16 07/25/19 1030  SpO2 100 % 07/25/19 1030    Last Pain:  Vitals:   07/25/19 0847  TempSrc: Oral  PainSc: 0-No pain         Complications: No apparent anesthesia complications

## 2019-07-25 NOTE — Anesthesia Post-op Follow-up Note (Signed)
Anesthesia QCDR form completed.        

## 2019-07-25 NOTE — Anesthesia Postprocedure Evaluation (Signed)
Anesthesia Post Note  Patient: Julia Edwards  Procedure(s) Performed: COLONOSCOPY WITH PROPOFOL (N/A )  Patient location during evaluation: Endoscopy Anesthesia Type: General Level of consciousness: awake and alert and oriented Pain management: pain level controlled Vital Signs Assessment: post-procedure vital signs reviewed and stable Respiratory status: spontaneous breathing, nonlabored ventilation and respiratory function stable Cardiovascular status: blood pressure returned to baseline and stable Postop Assessment: no signs of nausea or vomiting Anesthetic complications: no     Last Vitals:  Vitals:   07/25/19 1057 07/25/19 1100  BP: 120/81   Pulse: (!) 57 64  Resp: 12 14  Temp:    SpO2: 100% 100%    Last Pain:  Vitals:   07/25/19 0847  TempSrc: Oral  PainSc: 0-No pain                 Shaleena Crusoe

## 2019-07-25 NOTE — Anesthesia Preprocedure Evaluation (Signed)
Anesthesia Evaluation  Patient identified by MRN, date of birth, ID band Patient awake    Reviewed: Allergy & Precautions, NPO status , Patient's Chart, lab work & pertinent test results  History of Anesthesia Complications Negative for: history of anesthetic complications  Airway Mallampati: II  TM Distance: >3 FB Neck ROM: Full    Dental no notable dental hx.    Pulmonary neg pulmonary ROS, neg sleep apnea, neg COPD,    breath sounds clear to auscultation- rhonchi (-) wheezing      Cardiovascular hypertension, Pt. on medications (-) CAD, (-) Past MI, (-) Cardiac Stents and (-) CABG  Rhythm:Regular Rate:Normal - Systolic murmurs and - Diastolic murmurs    Neuro/Psych neg Seizures PSYCHIATRIC DISORDERS Anxiety Depression negative neurological ROS     GI/Hepatic negative GI ROS, Neg liver ROS,   Endo/Other  negative endocrine ROSneg diabetes  Renal/GU negative Renal ROS     Musculoskeletal negative musculoskeletal ROS (+)   Abdominal (+) - obese,   Peds  Hematology negative hematology ROS (+)   Anesthesia Other Findings Past Medical History: No date: Anxiety No date: Depression No date: Fibroids     Comment:  h/o No date: Hearing impaired person, bilateral No date: Heavy periods No date: Hyperlipemia No date: Hypertension No date: Painful menstrual periods No date: RLS (restless legs syndrome) No date: Vaginal Pap smear, abnormal     Comment:  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    Reproductive/Obstetrics                             Anesthesia Physical Anesthesia Plan  ASA: II  Anesthesia Plan: General   Post-op Pain Management:    Induction: Intravenous  PONV Risk Score and Plan: 2 and Propofol infusion  Airway Management Planned: Natural Airway  Additional Equipment:   Intra-op Plan:   Post-operative Plan:   Informed Consent: I have reviewed the patients History and Physical,  chart, labs and discussed the procedure including the risks, benefits and alternatives for the proposed anesthesia with the patient or authorized representative who has indicated his/her understanding and acceptance.     Dental advisory given  Plan Discussed with: CRNA and Anesthesiologist  Anesthesia Plan Comments:         Anesthesia Quick Evaluation

## 2019-07-25 NOTE — H&P (Signed)
[name], MD [address], [place], [place], [place], [address] [address], [place], [place], [place], [address] Phone: [phone]  Fax: [phone]  Primary Care Physician:  Derinda Late, MD   Pre-Procedure History & Physical: HPI:  Julia Edwards is a 53 y.o. female is here for a colonoscopy.   Past Medical History:  Diagnosis Date  . Anxiety   . Depression   . Fibroids    h/o  . Hearing impaired person, bilateral   . Heavy periods   . Hyperlipemia   . Hypertension   . Painful menstrual periods   . RLS (restless legs syndrome)   . 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 ABDOMINAL HYSTERECTOMY  2006   lsh  . colposcopy    . INNER EAR SURGERY  [id number]  . KNEE SURGERY Left   . TONSILLECTOMY      Prior to Admission medications   Medication Sig Start Date End Date Taking? Authorizing Provider  buPROPion (WELLBUTRIN) 100 MG tablet TAKE 1 TABLET BY MOUTH TWICE A DAY AT 8AM AND 3PM 05/24/18  Yes [provider]  [employer] VITAMIN B12 1000 MCG tablet Take 1,000 mcg by mouth daily. 01/16/19  Yes [provider]  lisinopril-hydrochlorothiazide (ZESTORETIC) 10-12.5 MG tablet Take 0.5 tablets by mouth daily.   Yes [provider]  sertraline (ZOLOFT) 100 MG tablet Take 100 mg by mouth daily.   Yes [provider]  Vitamin D, Ergocalciferol, (DRISDOL) 1.25 MG (50000 UT) CAPS capsule Take 1 capsule (50,000 Units total) by mouth 2 (two) times a week. 03/13/19  Yes Shambley, Melody N, CNM  lisinopril-hydrochlorothiazide (PRINZIDE,ZESTORETIC) 10-12.5 MG tablet Take by mouth. 03/29/18 03/29/19  [provider]  Na Sulfate-K Sulfate-Mg Sulf (SUPREP BOWEL PREP KIT) 17.5-3.13-1.6 GM/177ML SOLN Take 1 kit by mouth as directed. 07/21/19   Virgel Manifold, MD    Allergies as of 06/17/2019 - Review Complete 03/07/2019  Allergen Reaction Noted  . Codeine  Itching and Nausea And Vomiting 09/20/2015  . Darvon [propoxyphene] Itching and Nausea And Vomiting 09/20/2015  . Sulfa antibiotics  09/21/2015  . Ultram [tramadol hcl] Itching and Nausea And Vomiting 09/20/2015    Family History  Problem Relation Age of Onset  . COPD Mother   . Lung cancer Father   . Heart disease Maternal Grandmother   . Heart disease Maternal Grandfather   . Heart disease Paternal Grandmother   . Heart disease Paternal Grandfather   . Breast cancer Maternal Aunt 47  . Colon cancer Maternal Uncle 64  . Breast cancer Other        cousin  . Cancer Neg Hx   . Diabetes Neg Hx     Social History   Socioeconomic History  . Marital status: Married    Spouse name: Not on file  . Number of children: Not on file  . Years of education: Not on file  . Highest education level: Not on file  Occupational History  . Not on file  Social Needs  . Financial resource strain: Not on file  . Food insecurity    Worry: Not on file    Inability: Not on file  . Transportation needs    Medical: Not on file    Non-medical: Not on file  Tobacco Use  . Smoking status: Never Smoker  . Smokeless tobacco: Never Used  Substance and Sexual Activity  . Alcohol use:  Yes    Comment: occas  . Drug use: No  . Sexual activity: Yes    Birth control/protection: Surgical  Lifestyle  . Physical activity    Days per week: 0 days    Minutes per session: 0 min  . Stress: Not on file  Relationships  . Social Herbalist on phone: Not on file    Gets together: Not on file    Attends religious service: Not on file    Active member of club or organization: Not on file    Attends meetings of clubs or organizations: Not on file    Relationship status: Not on file  . Intimate partner violence    Fear of current or ex partner: Not on file    Emotionally abused: Not on file    Physically abused: Not on file    Forced sexual activity: Not on file  Other Topics Concern  . Not on  file  Social History Narrative  . Not on file    Review of Systems: See HPI, otherwise negative ROS  Physical Exam: BP 118/85   Pulse 80   Temp (!) 97 F (36.1 C) (Oral)   Resp 20   Ht '5\' 1"'  (1.549 m)   Wt 64.4 kg   SpO2 98%   BMI 26.83 kg/m  General:   Alert,  pleasant and cooperative in NAD Head:  Normocephalic and atraumatic. Neck:  Supple; no masses or thyromegaly. Lungs:  Clear throughout to auscultation, normal respiratory effort.    Heart:  +S1, +S2, Regular rate and rhythm, No edema. Abdomen:  Soft, nontender and nondistended. Normal bowel sounds, without guarding, and without rebound.   Neurologic:  Alert and  oriented x4;  grossly normal neurologically.  Impression/Plan: Julia Edwards is here for a colonoscopy to be performed for average risk screening.  Risks, benefits, limitations, and alternatives regarding  colonoscopy have been reviewed with the patient.  Questions have been answered.  All parties agreeable.   Virgel Manifold, MD  07/25/2019, 9:21 AM

## 2019-07-28 ENCOUNTER — Encounter: Payer: Self-pay | Admitting: Gastroenterology

## 2019-07-28 LAB — SURGICAL PATHOLOGY

## 2019-09-12 ENCOUNTER — Encounter: Payer: BC Managed Care – PPO | Admitting: Obstetrics and Gynecology

## 2019-09-22 ENCOUNTER — Other Ambulatory Visit: Payer: Self-pay | Admitting: Student

## 2019-09-22 DIAGNOSIS — M5416 Radiculopathy, lumbar region: Secondary | ICD-10-CM

## 2019-09-29 ENCOUNTER — Ambulatory Visit
Admission: RE | Admit: 2019-09-29 | Discharge: 2019-09-29 | Disposition: A | Discharge status: Home / Self Care | Payer: BC Managed Care – PPO | Source: Ambulatory Visit | Attending: Student | Admitting: Student

## 2019-09-29 ENCOUNTER — Other Ambulatory Visit: Payer: Self-pay

## 2019-09-29 DIAGNOSIS — M5416 Radiculopathy, lumbar region: Secondary | ICD-10-CM | POA: Insufficient documentation

## 2019-10-17 ENCOUNTER — Encounter: Payer: BC Managed Care – PPO | Admitting: Obstetrics and Gynecology

## 2019-12-14 IMAGING — MR MR LUMBAR SPINE W/O CM
5 series · 31 of 48 positions shown · non-contrast
Comparison: None.

CLINICAL DATA: Low back pain radiating into both buttocks and
halfway down the back of both thighs for the past 3 weeks.

EXAM:
MRI LUMBAR SPINE WITHOUT CONTRAST
TECHNIQUE: Multiplanar, multisequence MR imaging of the lumbar spine was
performed. No intravenous contrast was administered.

[Series 5: T2 · sagittal · 4.0mm · 0.81mm/px · 5 of 13 slices shown (1 of 2)]
[im 1/13]
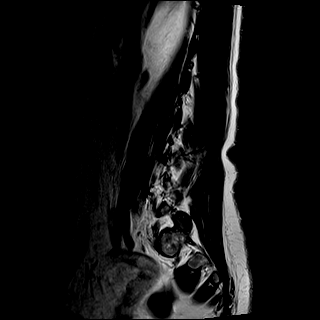
[im 4/13]
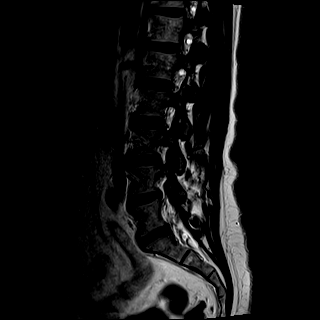
[im 7/13]
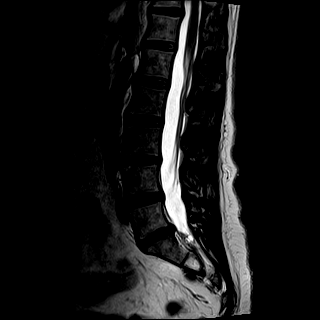
[im 10/13]
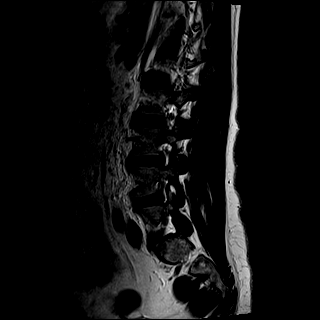
[im 13/13]
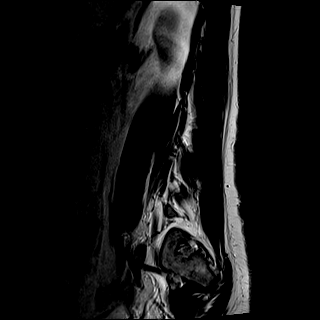

[Series 6: T1 · sagittal · 4.0mm · 0.81mm/px · 5 of 13 slices shown (1 of 2)]
[im 1/13]
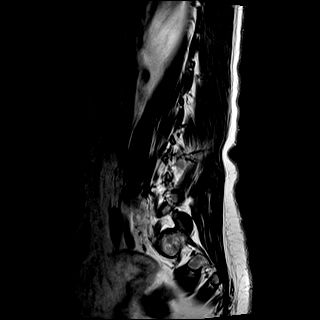
[im 4/13]
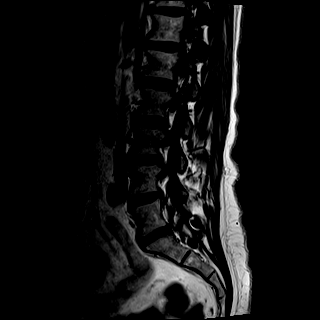
[im 7/13]
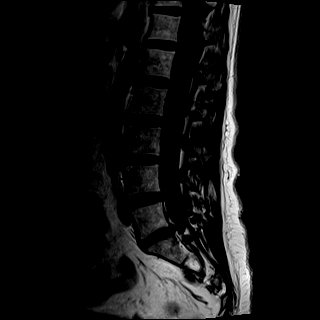
[im 10/13]
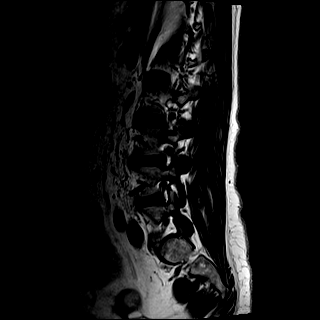
[im 13/13]
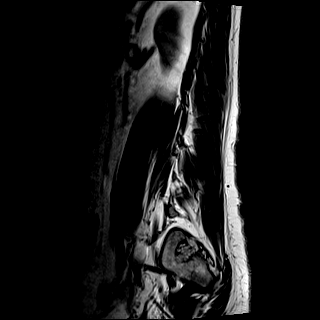

[Series 7: STIR · sagittal · 4.0mm · 0.41mm/px · 1 of 13 slices shown]
[im 1/13]
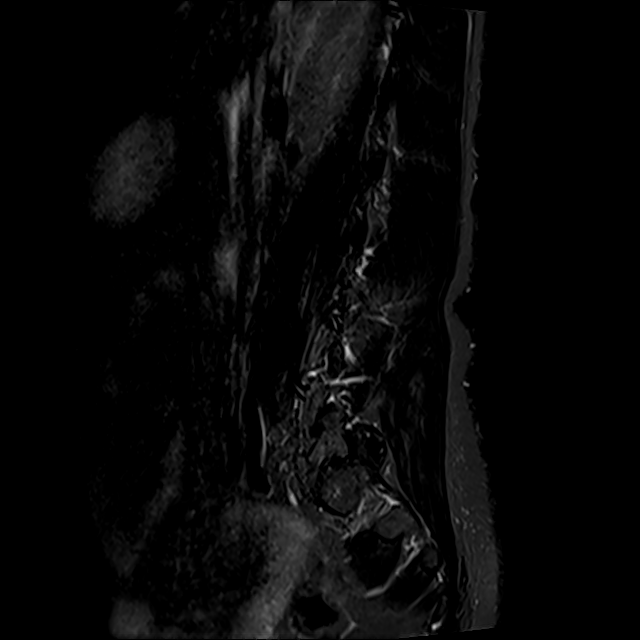

[Series 8: T2 · axial · 4.0mm · 0.78mm/px · z∈[-190,+14]mm · 10 of 36 slices shown (2 of 2)]
[im 3/36]
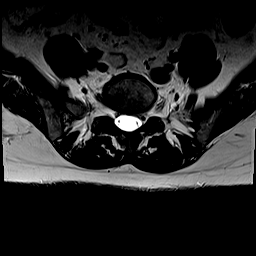
[im 5/36]
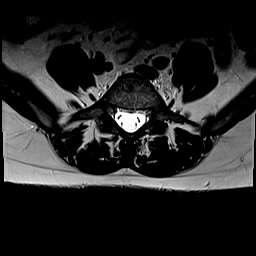
[im 8/36]
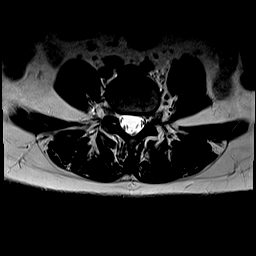
[im 12/36]
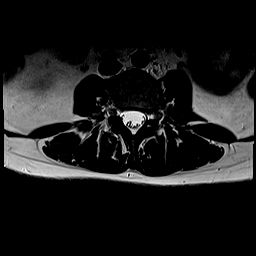
[im 17/36]
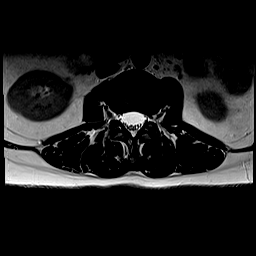
[im 19/36]
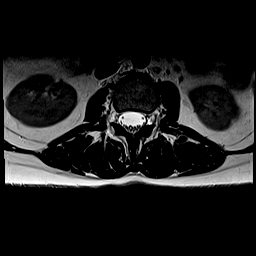
[im 22/36]
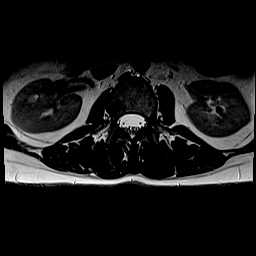
[im 26/36]
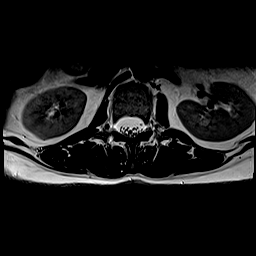
[im 31/36]
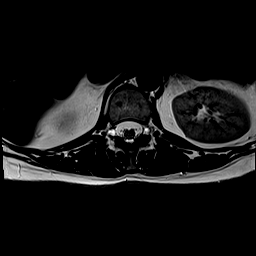
[im 36/36]
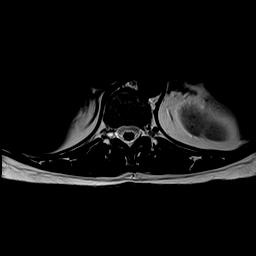

[Series 9: T1 · axial · 4.0mm · 0.39mm/px · z∈[-190,+14]mm · 10 of 36 slices shown (2 of 2)]
[im 3/36]
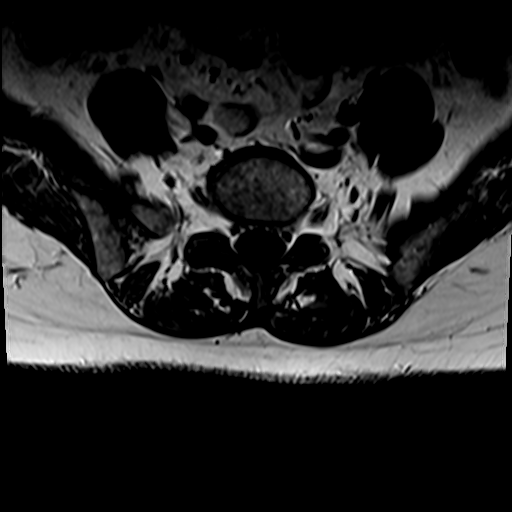
[im 5/36]
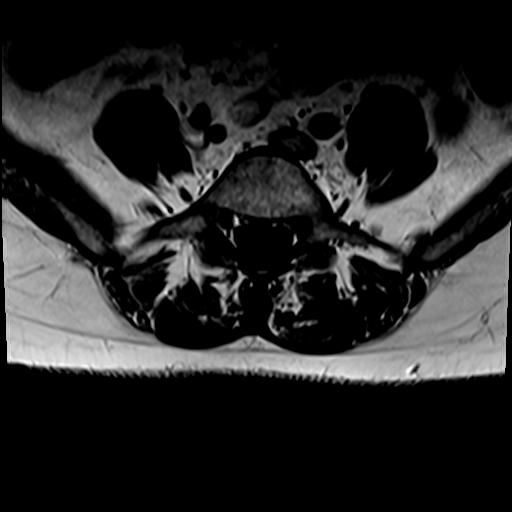
[im 8/36]
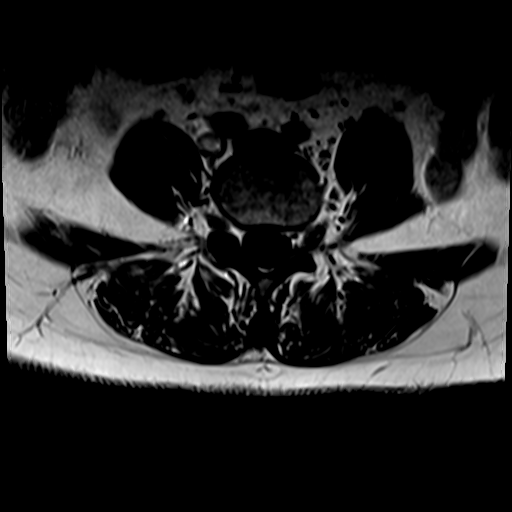
[im 12/36]
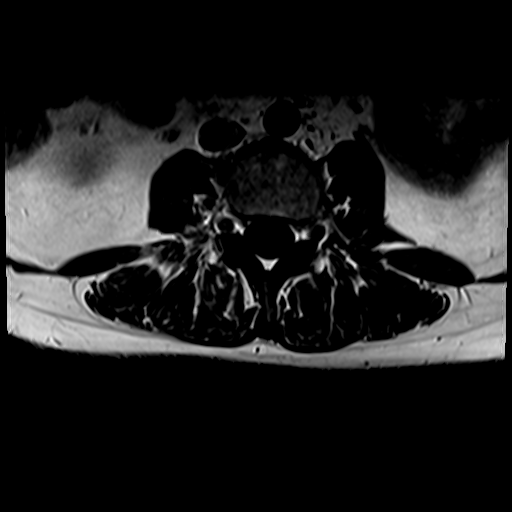
[im 17/36]
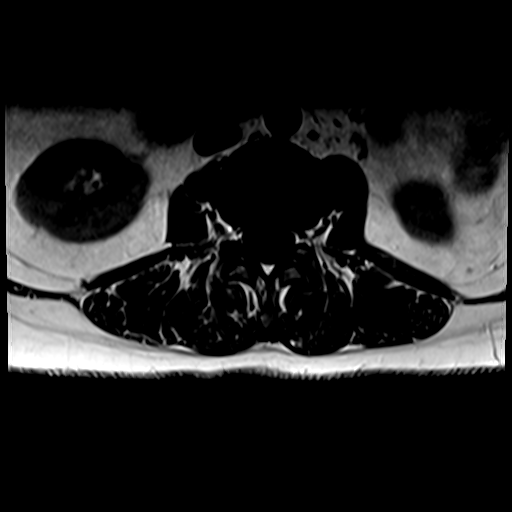
[im 19/36]
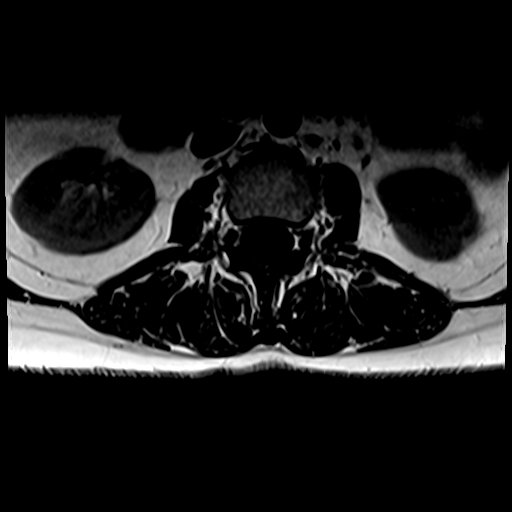
[im 22/36]
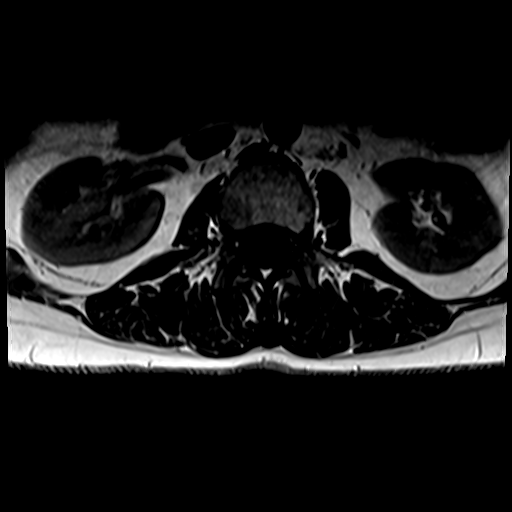
[im 26/36]
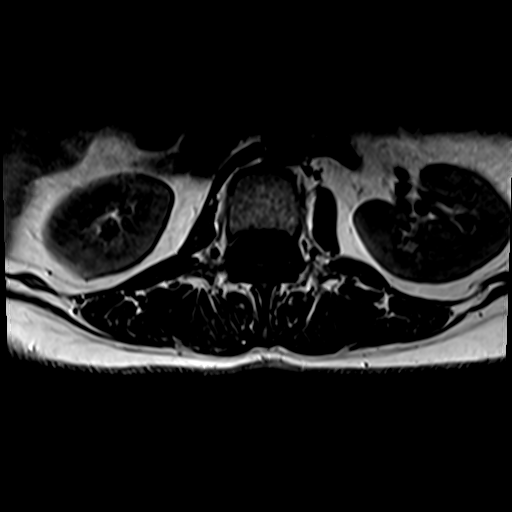
[im 31/36]
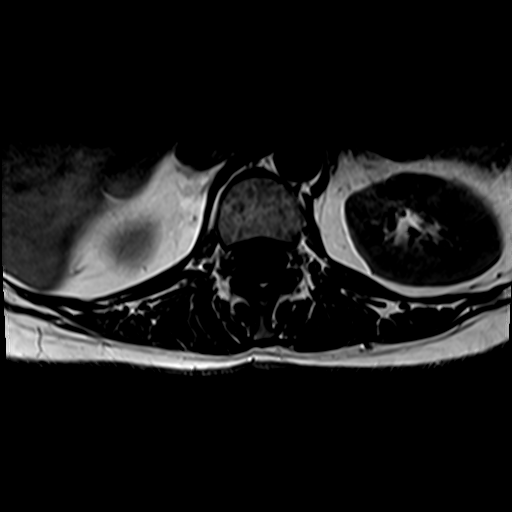
[im 36/36]
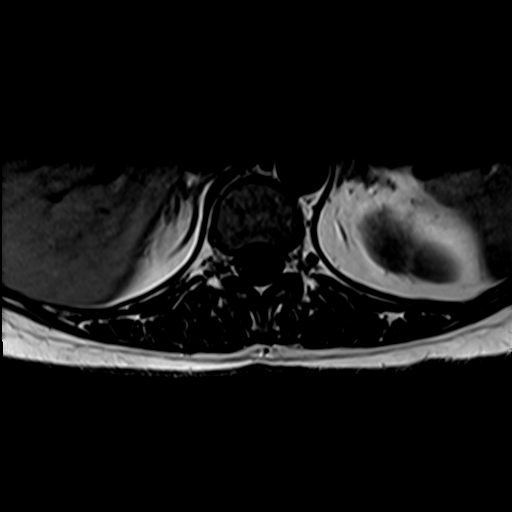

[31 of 48 positions shown; findings below may reference images not displayed]

FINDINGS: Segmentation: Assumed standard. The last well-formed disc space is
designated L5-S1 for the purposes of this report.

Alignment:  Physiologic.

Vertebrae:  No fracture, evidence of discitis, or bone lesion.

Conus medullaris and cauda equina: Conus extends to the L1 level.
Conus and cauda equina appear normal.

Paraspinal and other soft tissues: Negative.

Disc levels:

T11-T12: Small central disc protrusion.  No stenosis.

T12-L1 to L2-L3:  Negative.

L3-L4: Mild diffuse disc bulging with superimposed small shallow
left foraminal disc protrusion slightly displacing the left L3 nerve
root. No stenosis.

L4-L5: Small broad-based posterior disc protrusion with superimposed
focal left subarticular protrusion posterior displacing the
descending left L5 nerve root. Borderline mild bilateral
neuroforaminal stenosis. No spinal canal stenosis.

L5-S1: Small left paracentral and subarticular disc protrusion
slightly posteriorly displacing the descending left S1 nerve root.
No stenosis.
IMPRESSION: 1. Mild multilevel degenerative disc disease as described above.
Left-sided disc protrusions from L3-L4 through L5-S1 with slight
mass effect on the left L3, L5, and S1 nerve roots, but no obvious
impingement.

## 2022-02-28 DIAGNOSIS — F5105 Insomnia due to other mental disorder: Secondary | ICD-10-CM | POA: Diagnosis not present

## 2022-02-28 DIAGNOSIS — R69 Illness, unspecified: Secondary | ICD-10-CM | POA: Diagnosis not present

## 2023-04-04 DIAGNOSIS — F5105 Insomnia due to other mental disorder: Secondary | ICD-10-CM | POA: Diagnosis not present

## 2023-04-04 DIAGNOSIS — F3341 Major depressive disorder, recurrent, in partial remission: Secondary | ICD-10-CM | POA: Diagnosis not present

## 2023-04-04 DIAGNOSIS — F411 Generalized anxiety disorder: Secondary | ICD-10-CM | POA: Diagnosis not present

## 2023-04-10 DIAGNOSIS — I10 Essential (primary) hypertension: Secondary | ICD-10-CM | POA: Diagnosis not present

## 2023-04-10 DIAGNOSIS — R519 Headache, unspecified: Secondary | ICD-10-CM | POA: Diagnosis not present

## 2023-04-10 DIAGNOSIS — E785 Hyperlipidemia, unspecified: Secondary | ICD-10-CM | POA: Diagnosis not present

## 2023-04-10 DIAGNOSIS — F419 Anxiety disorder, unspecified: Secondary | ICD-10-CM | POA: Diagnosis not present

## 2023-04-10 DIAGNOSIS — G2581 Restless legs syndrome: Secondary | ICD-10-CM | POA: Diagnosis not present

## 2023-07-16 ENCOUNTER — Other Ambulatory Visit: Payer: Self-pay | Admitting: Oncology

## 2023-07-16 DIAGNOSIS — Z006 Encounter for examination for normal comparison and control in clinical research program: Secondary | ICD-10-CM

## 2023-08-01 DIAGNOSIS — F3341 Major depressive disorder, recurrent, in partial remission: Secondary | ICD-10-CM | POA: Diagnosis not present

## 2023-08-01 DIAGNOSIS — F411 Generalized anxiety disorder: Secondary | ICD-10-CM | POA: Diagnosis not present

## 2023-08-01 DIAGNOSIS — F5105 Insomnia due to other mental disorder: Secondary | ICD-10-CM | POA: Diagnosis not present

## 2023-11-13 ENCOUNTER — Encounter: Payer: Self-pay | Admitting: Obstetrics

## 2023-11-13 ENCOUNTER — Other Ambulatory Visit (HOSPITAL_COMMUNITY)
Admission: RE | Admit: 2023-11-13 | Discharge: 2023-11-13 | Disposition: A | Discharge status: Home / Self Care | Payer: 59 | Source: Ambulatory Visit | Attending: Obstetrics | Admitting: Obstetrics

## 2023-11-13 ENCOUNTER — Ambulatory Visit: Payer: 59 | Admitting: Obstetrics

## 2023-11-13 VITALS — BP 120/80 | HR 62 | Ht 60.5 in | Wt 139.0 lb

## 2023-11-13 DIAGNOSIS — Z01419 Encounter for gynecological examination (general) (routine) without abnormal findings: Secondary | ICD-10-CM

## 2023-11-13 DIAGNOSIS — R87619 Unspecified abnormal cytological findings in specimens from cervix uteri: Secondary | ICD-10-CM

## 2023-11-13 DIAGNOSIS — Z1231 Encounter for screening mammogram for malignant neoplasm of breast: Secondary | ICD-10-CM

## 2023-11-13 DIAGNOSIS — R102 Pelvic and perineal pain: Secondary | ICD-10-CM

## 2023-11-13 DIAGNOSIS — R87810 Cervical high risk human papillomavirus (HPV) DNA test positive: Secondary | ICD-10-CM

## 2023-11-13 DIAGNOSIS — R1031 Right lower quadrant pain: Secondary | ICD-10-CM

## 2023-11-13 DIAGNOSIS — Z124 Encounter for screening for malignant neoplasm of cervix: Secondary | ICD-10-CM

## 2023-11-13 NOTE — Progress Notes (Signed)
GYNECOLOGY ANNUAL EXAM NOTE   Subjective:    PCP: Kandyce Rud, MD Julia Edwards is a 57 y.o. female G0P0000 who presents for pelvic pain in her lower right side, started late spring. With in the last month has felt this pain more often. Describes as a dull pain, comes and goes, but is almost always there. Last 3 weeks has needed a heating pad for pain. Of note, on chart review, patient has had several abnormal pap smears and colposcopies. She is s/p partial hysterectomy and has not followed up on her paps or come for her annual since 2020.   GYN HISTORY:  No LMP recorded. Patient has had a hysterectomy.      OB History     Gravida  0   Para  0   Term  0   Preterm  0   AB  0   Living  0      SAB  0   IAB  0   Ectopic  0   Multiple  0   Live Births              Menarche age: 34 No LMP recorded. Patient has had a hysterectomy.   Intermenstrual bleeding, spotting, or discharge? no Urinary incontinence? no  Sexually active: no  Number of sexual partners: none  Gender of sexual Partners: n/a Social History   Substance and Sexual Activity  Sexual Activity Yes   Birth control/protection: Surgical   Contraceptive methods: n/a Dyspareunia? no STI history: no  STI/HIV testing or immunizations needed? No.   Health Maintenance: -Last pap: was abnormal: 03/07/19   --> Any abnormals: ASCUS POSITIVE HPV 04/29/2015 colposcopy: Adequate; ECC-negative 01/27/2016 Pap smear-LGSIL 04/25/2016 colposcopy: Inadequate; ECC-negative; endocervical canal dilation with lacrimal duct probes is performed 01/23/2017 colposcopy: Inadequate; no biopsies; Pap smear LGSIL/negative 02/25/2018 Pap: ASCUS/positive HPV 18/negative HPV 16/positive HPV other types 11/27/2018 colposcopy, inadequate; PapLSIL, HPV positive, 16/18/45 negative 03/07/2019 Pap: ASCUS, HPV positive, 16/18/45 negative  -Last mammogram: 02/07/2017 --> Any abnormals? NO -Last colon cancer screen:  07/25/2019 / Type: COLONOSCOPY -Last DEXA scan: NO  -FMH of Breast / Colon / Cervical cancer: YES aunt maternal breast, uncle colon cancer  -Vaccines:  Immunization History  Administered Date(s) Administered   Tdap 09/02/2015   Last Tdap: utd / Flu: utd / COVID: utd / Gardasil: aged out / Shingles (50+): no  / PCV20: n/a -Hep C screen: no  -Last lipid / glucose screening: PCP   > Exercise: walking, moderately active > Dietary Supplements: Folate: No;  Calcium: No}; Vitamin D: No > Body mass index is 26.7 kg/m.  > Recent dental visit No. > Seat Belt Use: Yes.   > Texting and driving? No. > Guns in the house No. > Recreational or other drug use: denied.   Social History   Tobacco Use   Smoking status: Never   Smokeless tobacco: Never  Substance Use Topics   Alcohol use: Yes    Comment: occas  _________________________________________________________  Current Outpatient Medications  Medication Sig Dispense Refill   lisinopril-hydrochlorothiazide (ZESTORETIC) 10-12.5 MG tablet Take 0.5 tablets by mouth daily.     sertraline (ZOLOFT) 100 MG tablet Take 100 mg by mouth daily.     buPROPion (WELLBUTRIN) 100 MG tablet TAKE 1 TABLET BY MOUTH TWICE A DAY AT 8AM AND 3PM (Patient not taking: Reported on 11/13/2023)     [employer] VITAMIN B12 1000 MCG tablet Take 1,000 mcg by mouth daily. (Patient not taking: Reported on 11/13/2023)  lisinopril-hydrochlorothiazide (PRINZIDE,ZESTORETIC) 10-12.5 MG tablet Take by mouth.     Na Sulfate-K Sulfate-Mg Sulf (SUPREP BOWEL PREP KIT) 17.5-3.13-1.6 GM/177ML SOLN Take 1 kit by mouth as directed. (Patient not taking: Reported on 11/13/2023) 354 mL 0   Vitamin D, Ergocalciferol, (DRISDOL) 1.25 MG (50000 UT) CAPS capsule Take 1 capsule (50,000 Units total) by mouth 2 (two) times a week. (Patient not taking: Reported on 11/13/2023) 30 capsule 2   No current facility-administered medications for this visit.   Allergies  Allergen Reactions   Codeine Itching  and Nausea And Vomiting   Darvon [Propoxyphene] Itching and Nausea And Vomiting   Sulfa Antibiotics    Ultram [Tramadol Hcl] Itching and Nausea And Vomiting    Past Medical History:  Diagnosis Date   Anxiety    Depression    Fibroids    h/o   Hearing impaired person, bilateral    Heavy periods    Hyperlipemia    Hypertension    Painful menstrual periods    RLS (restless legs syndrome)    Vaginal Pap smear, abnormal    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    Past Surgical History:  Procedure Laterality Date   ABDOMINAL HYSTERECTOMY  2006   lsh   COLONOSCOPY WITH PROPOFOL N/A 07/25/2019   Procedure: COLONOSCOPY WITH PROPOFOL;  Surgeon: Pasty Spillers, MD;  Location: [facility];  Service: Endoscopy;  Laterality: N/A;   colposcopy     INNER EAR SURGERY  [id number]   KNEE SURGERY Left    TONSILLECTOMY      Review Of Systems  Constitutional: Denied constitutional symptoms, night sweats, recent illness, fatigue, fever, insomnia and weight loss.  Eyes: Denied eye symptoms, eye pain, photophobia, vision change and visual disturbance.  Ears/Nose/Throat/Neck: Denied ear, nose, throat or neck symptoms, hearing loss, nasal discharge, sinus congestion and sore throat.  Cardiovascular: Denied cardiovascular symptoms, arrhythmia, chest pain/pressure, edema, exercise intolerance, orthopnea and palpitations.  Respiratory: Denied pulmonary symptoms, asthma, pleuritic pain, productive sputum, cough, dyspnea and wheezing.  Gastrointestinal: Denied, gastro-esophageal reflux, melena, nausea and vomiting.  Genitourinary: Denied genitourinary symptoms including symptomatic vaginal discharge, pelvic relaxation issues, and urinary complaints. Endorses RLQ pelvic pain.   Musculoskeletal: Denied musculoskeletal symptoms, stiffness, swelling, muscle weakness and myalgia.  Dermatologic: Denied dermatology symptoms, rash and scar.  Neurologic: Denied neurology symptoms, dizziness, headache, neck pain and syncope.   Psychiatric: Denied psychiatric symptoms, anxiety and depression.  Endocrine: Denied endocrine symptoms including hot flashes and night sweats.      Objective:    BP 120/80   Pulse 62   Ht 5' 0.5" (1.537 m)   Wt 139 lb (63 kg)   BMI 26.70 kg/m   Constitutional: Well-developed, well-nourished female in no acute distress Neurological: Alert and oriented to person, place, and time Psychiatric: Mood and affect appropriate Skin: No rashes or lesions Neck: Supple without masses. Trachea is midline.Thyroid is normal size without masses Lymphatics: No cervical, axillary, supraclavicular, or inguinal adenopathy noted Respiratory: Clear to auscultation bilaterally. Good air movement with normal work of breathing. Cardiovascular: Regular rate and rhythm. Extremities grossly normal, nontender with no edema; pulses regular Gastrointestinal: Soft, nontender, nondistended. No masses or hernias appreciated. No hepatosplenomegaly. No fluid wave. No rebound or guarding. Breast Exam: deferred Genitourinary:         External Genitalia: Normal female genitalia    Vagina: Atrophic mucosa, no lesions.    Cervix: No lesions, small, severely stenotic os, no cervical motion tenderness; non-friable; Pap not obtained.    Uterus: surgically absent Adnexae: Non-palpable  and non-tender Perineum/Anus: No lesions Rectal: deferred    Assessment/Plan:    Julia Edwards is a 57 y.o. female G0P0000 here for RLQ pelvic pain, updated cervical cancer and breast cancer screens today.   -Screenings:  Pap: done with cotesting today -- long history of abnormal results and abnormal or inadequate colposcopies. Consider having cervix surgically removed.  Mammogram: ordered -Contraception: post-menopausal -Vaccines: Recommend Shingrix and PCV20 through PCP or pharmacy -Healthy lifestyle modifications discussed: multivitamin, diet, exercise, sunscreen, tobacco and alcohol use. Emphasized importance of regular  physical activity.  -Calcium and Vit D recommendation reviewed.  -Recommend she see PCP for colon cancer screening and vaccines.  -RTC 1 yr for annual, sooner prn.   RLQ pelvic pain: nothing appreciated on exam, will order pelvic US to assess ovaries.  Return in about 2 weeks (around 11/27/2023) for Pelvic US.    Julieanne Manson, DO [facility]

## 2023-11-16 LAB — CYTOLOGY - PAP
Adequacy: ABSENT
Comment: NEGATIVE
Diagnosis: NEGATIVE
High risk HPV: NEGATIVE

## 2023-12-10 ENCOUNTER — Ambulatory Visit (INDEPENDENT_AMBULATORY_CARE_PROVIDER_SITE_OTHER): Payer: 59

## 2023-12-10 DIAGNOSIS — R102 Pelvic and perineal pain: Secondary | ICD-10-CM

## 2023-12-10 DIAGNOSIS — R1031 Right lower quadrant pain: Secondary | ICD-10-CM | POA: Diagnosis not present

## 2023-12-18 DIAGNOSIS — F411 Generalized anxiety disorder: Secondary | ICD-10-CM | POA: Diagnosis not present

## 2023-12-18 DIAGNOSIS — F5105 Insomnia due to other mental disorder: Secondary | ICD-10-CM | POA: Diagnosis not present

## 2023-12-18 DIAGNOSIS — F3341 Major depressive disorder, recurrent, in partial remission: Secondary | ICD-10-CM | POA: Diagnosis not present

## 2024-10-10 ENCOUNTER — Other Ambulatory Visit: Payer: Self-pay | Admitting: Nurse Practitioner

## 2024-10-10 DIAGNOSIS — E78019 Familial hypercholesterolemia, unspecified: Secondary | ICD-10-CM

## 2024-10-10 DIAGNOSIS — I10 Essential (primary) hypertension: Secondary | ICD-10-CM

## 2024-10-10 DIAGNOSIS — Z8249 Family history of ischemic heart disease and other diseases of the circulatory system: Secondary | ICD-10-CM

## 2024-10-13 ENCOUNTER — Other Ambulatory Visit: Payer: Self-pay | Admitting: Medical Genetics

## 2024-10-13 DIAGNOSIS — Z006 Encounter for examination for normal comparison and control in clinical research program: Secondary | ICD-10-CM

## 2024-11-07 ENCOUNTER — Ambulatory Visit
Admission: RE | Admit: 2024-11-07 | Discharge: 2024-11-07 | Disposition: A | Discharge status: Home / Self Care | Payer: Self-pay | Source: Ambulatory Visit | Attending: Nurse Practitioner | Admitting: Nurse Practitioner

## 2024-11-07 DIAGNOSIS — I10 Essential (primary) hypertension: Secondary | ICD-10-CM | POA: Insufficient documentation

## 2024-11-07 DIAGNOSIS — Z8249 Family history of ischemic heart disease and other diseases of the circulatory system: Secondary | ICD-10-CM | POA: Insufficient documentation

## 2024-11-07 DIAGNOSIS — E78019 Familial hypercholesterolemia, unspecified: Secondary | ICD-10-CM | POA: Insufficient documentation
